# Patient Record
Sex: Male | Born: 1950 | Race: White | Hispanic: No | Marital: Married | Smoking: Former smoker
Health system: Southern US, Community
[De-identification: ages and names within clinical notes are randomized; demographics above are authoritative.]

## PROBLEM LIST (undated history)

## (undated) DIAGNOSIS — R51 Headache: Secondary | ICD-10-CM

## (undated) DIAGNOSIS — E119 Type 2 diabetes mellitus without complications: Secondary | ICD-10-CM

## (undated) DIAGNOSIS — R519 Headache, unspecified: Secondary | ICD-10-CM

## (undated) DIAGNOSIS — N200 Calculus of kidney: Secondary | ICD-10-CM

## (undated) DIAGNOSIS — R7303 Prediabetes: Secondary | ICD-10-CM

## (undated) DIAGNOSIS — D496 Neoplasm of unspecified behavior of brain: Secondary | ICD-10-CM

## (undated) DIAGNOSIS — M1990 Unspecified osteoarthritis, unspecified site: Secondary | ICD-10-CM

## (undated) DIAGNOSIS — I499 Cardiac arrhythmia, unspecified: Secondary | ICD-10-CM

## (undated) DIAGNOSIS — R000 Tachycardia, unspecified: Secondary | ICD-10-CM

## (undated) DIAGNOSIS — G4730 Sleep apnea, unspecified: Secondary | ICD-10-CM

## (undated) DIAGNOSIS — K219 Gastro-esophageal reflux disease without esophagitis: Secondary | ICD-10-CM

## (undated) HISTORY — PX: OTHER SURGICAL HISTORY: SHX169

## (undated) HISTORY — PX: COLONOSCOPY: SHX174

---

## 2005-07-12 ENCOUNTER — Emergency Department: Payer: Self-pay | Admitting: Emergency Medicine

## 2010-03-30 ENCOUNTER — Ambulatory Visit: Payer: Self-pay | Admitting: Gastroenterology

## 2013-10-15 ENCOUNTER — Ambulatory Visit: Payer: Self-pay | Admitting: Family Medicine

## 2015-04-16 ENCOUNTER — Other Ambulatory Visit: Payer: Self-pay | Admitting: Neurology

## 2015-04-16 DIAGNOSIS — R51 Headache: Principal | ICD-10-CM

## 2015-04-16 DIAGNOSIS — R519 Headache, unspecified: Secondary | ICD-10-CM

## 2015-05-08 ENCOUNTER — Ambulatory Visit
Admission: RE | Admit: 2015-05-08 | Discharge: 2015-05-08 | Disposition: A | Discharge status: Home / Self Care | Payer: BLUE CROSS/BLUE SHIELD | Source: Ambulatory Visit | Attending: Neurology | Admitting: Neurology

## 2015-05-08 DIAGNOSIS — R6889 Other general symptoms and signs: Secondary | ICD-10-CM | POA: Diagnosis present

## 2015-05-08 DIAGNOSIS — R519 Headache, unspecified: Secondary | ICD-10-CM

## 2015-05-08 DIAGNOSIS — G44229 Chronic tension-type headache, not intractable: Secondary | ICD-10-CM | POA: Diagnosis present

## 2015-05-08 DIAGNOSIS — R51 Headache: Secondary | ICD-10-CM

## 2015-07-16 ENCOUNTER — Other Ambulatory Visit: Payer: Self-pay | Admitting: Neurology

## 2015-07-16 DIAGNOSIS — G44229 Chronic tension-type headache, not intractable: Secondary | ICD-10-CM

## 2015-07-16 DIAGNOSIS — R9089 Other abnormal findings on diagnostic imaging of central nervous system: Secondary | ICD-10-CM

## 2015-07-23 ENCOUNTER — Ambulatory Visit
Admission: RE | Admit: 2015-07-23 | Discharge: 2015-07-23 | Disposition: A | Discharge status: Home / Self Care | Payer: BLUE CROSS/BLUE SHIELD | Source: Ambulatory Visit | Attending: Neurology | Admitting: Neurology

## 2015-07-23 ENCOUNTER — Ambulatory Visit (HOSPITAL_COMMUNITY): Payer: BLUE CROSS/BLUE SHIELD

## 2015-07-23 DIAGNOSIS — R9089 Other abnormal findings on diagnostic imaging of central nervous system: Secondary | ICD-10-CM

## 2015-07-23 DIAGNOSIS — G9389 Other specified disorders of brain: Secondary | ICD-10-CM | POA: Insufficient documentation

## 2015-07-23 DIAGNOSIS — I6789 Other cerebrovascular disease: Secondary | ICD-10-CM | POA: Insufficient documentation

## 2015-07-23 DIAGNOSIS — G44229 Chronic tension-type headache, not intractable: Secondary | ICD-10-CM | POA: Insufficient documentation

## 2015-07-23 DIAGNOSIS — R930 Abnormal findings on diagnostic imaging of skull and head, not elsewhere classified: Secondary | ICD-10-CM | POA: Insufficient documentation

## 2015-07-23 MED ORDER — GADOBENATE DIMEGLUMINE 529 MG/ML IV SOLN
20.0000 mL | Freq: Once | INTRAVENOUS | Status: AC | PRN
  Administered 2015-07-23: 20 mL via INTRAVENOUS

## 2015-08-05 ENCOUNTER — Other Ambulatory Visit: Payer: Self-pay | Admitting: Neurosurgery

## 2015-08-05 ENCOUNTER — Other Ambulatory Visit (HOSPITAL_COMMUNITY): Payer: Self-pay | Admitting: Neurosurgery

## 2015-08-05 DIAGNOSIS — D496 Neoplasm of unspecified behavior of brain: Secondary | ICD-10-CM

## 2015-08-06 ENCOUNTER — Other Ambulatory Visit: Payer: Self-pay | Admitting: Neurosurgery

## 2015-08-06 DIAGNOSIS — D496 Neoplasm of unspecified behavior of brain: Secondary | ICD-10-CM

## 2015-08-11 ENCOUNTER — Ambulatory Visit
Admission: RE | Admit: 2015-08-11 | Discharge: 2015-08-11 | Disposition: A | Discharge status: Home / Self Care | Payer: BLUE CROSS/BLUE SHIELD | Source: Ambulatory Visit | Attending: Neurosurgery | Admitting: Neurosurgery

## 2015-08-11 DIAGNOSIS — N200 Calculus of kidney: Secondary | ICD-10-CM | POA: Insufficient documentation

## 2015-08-11 DIAGNOSIS — D496 Neoplasm of unspecified behavior of brain: Secondary | ICD-10-CM | POA: Diagnosis not present

## 2015-08-11 DIAGNOSIS — M5136 Other intervertebral disc degeneration, lumbar region: Secondary | ICD-10-CM | POA: Diagnosis not present

## 2015-08-11 DIAGNOSIS — K760 Fatty (change of) liver, not elsewhere classified: Secondary | ICD-10-CM | POA: Insufficient documentation

## 2015-08-11 DIAGNOSIS — K5730 Diverticulosis of large intestine without perforation or abscess without bleeding: Secondary | ICD-10-CM | POA: Diagnosis not present

## 2015-08-11 HISTORY — DX: Tachycardia, unspecified: R00.0

## 2015-08-11 LAB — POCT I-STAT CREATININE: Creatinine, Ser: 0.8 mg/dL (ref 0.61–1.24)

## 2015-08-11 MED ORDER — IOPAMIDOL (ISOVUE-370) INJECTION 76%
100.0000 mL | Freq: Once | INTRAVENOUS | Status: AC | PRN
  Administered 2015-08-11: 100 mL via INTRAVENOUS

## 2015-08-12 ENCOUNTER — Other Ambulatory Visit: Payer: Self-pay | Admitting: Neurosurgery

## 2015-08-14 ENCOUNTER — Encounter (HOSPITAL_COMMUNITY)
Admission: RE | Admit: 2015-08-14 | Discharge: 2015-08-14 | Disposition: A | Discharge status: Home / Self Care | Payer: BLUE CROSS/BLUE SHIELD | Source: Ambulatory Visit | Attending: Neurosurgery | Admitting: Neurosurgery

## 2015-08-14 ENCOUNTER — Other Ambulatory Visit: Payer: Self-pay

## 2015-08-14 ENCOUNTER — Encounter (HOSPITAL_COMMUNITY): Payer: Self-pay

## 2015-08-14 DIAGNOSIS — G939 Disorder of brain, unspecified: Secondary | ICD-10-CM | POA: Diagnosis present

## 2015-08-14 HISTORY — DX: Unspecified osteoarthritis, unspecified site: M19.90

## 2015-08-14 HISTORY — DX: Type 2 diabetes mellitus without complications: E11.9

## 2015-08-14 HISTORY — DX: Gastro-esophageal reflux disease without esophagitis: K21.9

## 2015-08-14 HISTORY — DX: Headache: R51

## 2015-08-14 HISTORY — DX: Cardiac arrhythmia, unspecified: I49.9

## 2015-08-14 HISTORY — DX: Sleep apnea, unspecified: G47.30

## 2015-08-14 HISTORY — DX: Headache, unspecified: R51.9

## 2015-08-14 LAB — COMPREHENSIVE METABOLIC PANEL
ALK PHOS: 48 U/L (ref 38–126)
ALT: 61 U/L (ref 17–63)
ANION GAP: 10 (ref 5–15)
AST: 47 U/L — ABNORMAL HIGH (ref 15–41)
Albumin: 4.1 g/dL (ref 3.5–5.0)
BUN: 15 mg/dL (ref 6–20)
CALCIUM: 10.4 mg/dL — AB (ref 8.9–10.3)
CHLORIDE: 102 mmol/L (ref 101–111)
CO2: 24 mmol/L (ref 22–32)
Creatinine, Ser: 0.83 mg/dL (ref 0.61–1.24)
GFR calc non Af Amer: >60 mL/min (ref >60)
Glucose, Bld: 117 mg/dL — ABNORMAL HIGH (ref 65–99)
POTASSIUM: 4 mmol/L (ref 3.5–5.1)
SODIUM: 136 mmol/L (ref 135–145)
Total Bilirubin: 1.1 mg/dL (ref 0.3–1.2)
Total Protein: 7.5 g/dL (ref 6.5–8.1)

## 2015-08-14 LAB — CBC
HCT: 45.6 % (ref 39.0–52.0)
Hemoglobin: 15.7 g/dL (ref 13.0–17.0)
MCH: 31.8 pg (ref 26.0–34.0)
MCHC: 34.4 g/dL (ref 30.0–36.0)
MCV: 92.5 fL (ref 78.0–100.0)
PLATELETS: 231 10*3/uL (ref 150–400)
RBC: 4.93 MIL/uL (ref 4.22–5.81)
RDW: 12.8 % (ref 11.5–15.5)
WBC: 7.1 10*3/uL (ref 4.0–10.5)

## 2015-08-14 LAB — TYPE AND SCREEN
ABO/RH(D): A POS
ANTIBODY SCREEN: NEGATIVE

## 2015-08-14 LAB — ABO/RH: ABO/RH(D): A POS

## 2015-08-14 NOTE — Pre-Procedure Instructions (Signed)
David Archer [facility]  08/14/2015      [id number] [place] - [address] [place] [address]Phone: [phone]: [phone]   Your procedure is scheduled on Monday, April 17th   [facility]             (Arrival time is per your surgeon's request)   Call this number if you have problems the morning of surgery:  [phone]   Remember:  Do not eat food or drink liquids after midnight Sunday.   Take these medicines the morning of surgery with A SIP OF WATER : Flexeril, Metoprolol.  Please use your Flonase that mornig.             (4-5 days prior to surgery, STOP taking any blood thinners, vitamins, herbal supplements, anti-inflammatories)   Do not wear jewelry - no rings or watches.  Do not wear lotions or colognes.    You may NOT wear deodorant that morning.              Men may shave face and neck.  Do not bring valuables to the hospital.  [facility] or [facility].  Contacts, dentures or bridgework may not be worn into surgery.  Leave your suitcase in the car.  After surgery it may be brought to your room. For patients admitted to the hospital, discharge time will be determined by your treatment team.    Name and phone number of your driver:     Please read over the following fact sheets that you were given. Pain Booklet, Coughing and Deep Breathing and Surgical Site Infection Prevention

## 2015-08-14 NOTE — Progress Notes (Signed)
PCP is Dr. Kandice Robinsons  LOV 04/2015  Cardio is Dr. Christianne Borrow @ [facility] [phone] for tachycardia (dx in 2012 or 13) , which is  controlled with Metoprolol.  Office is closed today (Good Friday), so I am unable to get specific info.  I am sending a fax cover sheet to [facility] to hopefully have info by Monday am.  Patient has had Echo & Stress tests in 2012.  Denies any current heart issues.

## 2015-08-15 LAB — HEMOGLOBIN A1C
Hgb A1c MFr Bld: 6.9 % — ABNORMAL HIGH (ref 4.8–5.6)
Mean Plasma Glucose: 151 mg/dL

## 2015-08-17 ENCOUNTER — Other Ambulatory Visit: Payer: Self-pay

## 2015-08-17 ENCOUNTER — Inpatient Hospital Stay (HOSPITAL_COMMUNITY): Payer: BLUE CROSS/BLUE SHIELD | Admitting: Emergency Medicine

## 2015-08-17 ENCOUNTER — Inpatient Hospital Stay (HOSPITAL_COMMUNITY)
Admission: RE | Admit: 2015-08-17 | Discharge: 2015-08-20 | DRG: 055 | Disposition: A | Discharge status: Home / Self Care | Payer: BLUE CROSS/BLUE SHIELD | Source: Ambulatory Visit | Attending: Neurosurgery | Admitting: Neurosurgery

## 2015-08-17 ENCOUNTER — Encounter (HOSPITAL_COMMUNITY)
Admission: RE | Disposition: A | Discharge status: Home / Self Care | Payer: Self-pay | Source: Ambulatory Visit | Attending: Neurosurgery

## 2015-08-17 ENCOUNTER — Inpatient Hospital Stay (HOSPITAL_COMMUNITY): Payer: BLUE CROSS/BLUE SHIELD | Admitting: Anesthesiology

## 2015-08-17 ENCOUNTER — Ambulatory Visit (HOSPITAL_COMMUNITY)
Admission: RE | Admit: 2015-08-17 | Discharge: 2015-08-17 | Disposition: A | Discharge status: Home / Self Care | Payer: BLUE CROSS/BLUE SHIELD | Source: Ambulatory Visit | Attending: Neurosurgery | Admitting: Neurosurgery

## 2015-08-17 ENCOUNTER — Encounter (HOSPITAL_COMMUNITY): Payer: Self-pay | Admitting: *Deleted

## 2015-08-17 DIAGNOSIS — E785 Hyperlipidemia, unspecified: Secondary | ICD-10-CM | POA: Diagnosis present

## 2015-08-17 DIAGNOSIS — G939 Disorder of brain, unspecified: Secondary | ICD-10-CM | POA: Insufficient documentation

## 2015-08-17 DIAGNOSIS — K219 Gastro-esophageal reflux disease without esophagitis: Secondary | ICD-10-CM | POA: Diagnosis present

## 2015-08-17 DIAGNOSIS — I4891 Unspecified atrial fibrillation: Secondary | ICD-10-CM | POA: Diagnosis not present

## 2015-08-17 DIAGNOSIS — G4730 Sleep apnea, unspecified: Secondary | ICD-10-CM | POA: Diagnosis present

## 2015-08-17 DIAGNOSIS — D496 Neoplasm of unspecified behavior of brain: Principal | ICD-10-CM | POA: Diagnosis present

## 2015-08-17 DIAGNOSIS — Z87891 Personal history of nicotine dependence: Secondary | ICD-10-CM | POA: Diagnosis not present

## 2015-08-17 DIAGNOSIS — E119 Type 2 diabetes mellitus without complications: Secondary | ICD-10-CM

## 2015-08-17 DIAGNOSIS — I440 Atrioventricular block, first degree: Secondary | ICD-10-CM | POA: Diagnosis present

## 2015-08-17 DIAGNOSIS — Z9889 Other specified postprocedural states: Secondary | ICD-10-CM

## 2015-08-17 DIAGNOSIS — I4892 Unspecified atrial flutter: Secondary | ICD-10-CM | POA: Diagnosis not present

## 2015-08-17 DIAGNOSIS — Z7984 Long term (current) use of oral hypoglycemic drugs: Secondary | ICD-10-CM

## 2015-08-17 DIAGNOSIS — Z79899 Other long term (current) drug therapy: Secondary | ICD-10-CM | POA: Diagnosis not present

## 2015-08-17 DIAGNOSIS — I480 Paroxysmal atrial fibrillation: Secondary | ICD-10-CM | POA: Diagnosis not present

## 2015-08-17 DIAGNOSIS — G9389 Other specified disorders of brain: Secondary | ICD-10-CM

## 2015-08-17 DIAGNOSIS — G4733 Obstructive sleep apnea (adult) (pediatric): Secondary | ICD-10-CM | POA: Diagnosis present

## 2015-08-17 HISTORY — PX: PR DURAL GRAFT SPINAL: 63710

## 2015-08-17 HISTORY — PX: APPLICATION OF CRANIAL NAVIGATION: SHX6578

## 2015-08-17 LAB — BASIC METABOLIC PANEL WITH GFR
Anion gap: 11 (ref 5–15)
BUN: 18 mg/dL (ref 6–20)
CO2: 21 mmol/L — ABNORMAL LOW (ref 22–32)
Calcium: 8.8 mg/dL — ABNORMAL LOW (ref 8.9–10.3)
Chloride: 105 mmol/L (ref 101–111)
Creatinine, Ser: 0.79 mg/dL (ref 0.61–1.24)
GFR calc Af Amer: >60 mL/min
GFR calc non Af Amer: >60 mL/min
Glucose, Bld: 181 mg/dL — ABNORMAL HIGH (ref 65–99)
Potassium: 4 mmol/L (ref 3.5–5.1)
Sodium: 137 mmol/L (ref 135–145)

## 2015-08-17 LAB — MAGNESIUM: Magnesium: 1.7 mg/dL (ref 1.7–2.4)

## 2015-08-17 LAB — GLUCOSE, CAPILLARY
GLUCOSE-CAPILLARY: 104 mg/dL — AB (ref 65–99)
GLUCOSE-CAPILLARY: 183 mg/dL — AB (ref 65–99)
Glucose-Capillary: 137 mg/dL — ABNORMAL HIGH (ref 65–99)
Glucose-Capillary: 115 mg/dL — ABNORMAL HIGH (ref 65–99)
Glucose-Capillary: 98 mg/dL (ref 65–99)
Glucose-Capillary: 98 mg/dL (ref 65–99)

## 2015-08-17 SURGERY — FRAMELESS STEREOTACTIC BIOPSY
Anesthesia: General | Site: Head

## 2015-08-17 MED ORDER — PROPOFOL 10 MG/ML IV BOLUS
INTRAVENOUS | Status: DC | PRN
  Administered 2015-08-17: 150 mg via INTRAVENOUS
  Administered 2015-08-17: 50 mg via INTRAVENOUS

## 2015-08-17 MED ORDER — HYDROMORPHONE HCL 1 MG/ML IJ SOLN: 0.2500 mg | INTRAMUSCULAR | Status: DC | PRN

## 2015-08-17 MED ORDER — CEFAZOLIN SODIUM-DEXTROSE 2-4 GM/100ML-% IV SOLN
2.0000 g | INTRAVENOUS | Status: AC
  Administered 2015-08-17: 2 g via INTRAVENOUS

## 2015-08-17 MED ORDER — CEFAZOLIN SODIUM-DEXTROSE 2-4 GM/100ML-% IV SOLN
INTRAVENOUS | Status: AC
  Filled 2015-08-17: qty 100

## 2015-08-17 MED ORDER — PHENYLEPHRINE 40 MCG/ML (10ML) SYRINGE FOR IV PUSH (FOR BLOOD PRESSURE SUPPORT)
PREFILLED_SYRINGE | INTRAVENOUS | Status: AC
Start: 2015-08-17 — End: 2015-08-17
  Filled 2015-08-17: qty 10

## 2015-08-17 MED ORDER — ESMOLOL HCL 100 MG/10ML IV SOLN
INTRAVENOUS | Status: AC
  Filled 2015-08-17: qty 10

## 2015-08-17 MED ORDER — NORTRIPTYLINE HCL 10 MG PO CAPS
20.0000 mg | ORAL_CAPSULE | Freq: Every day | ORAL | Status: DC
  Administered 2015-08-17 – 2015-08-19 (×3): 20 mg via ORAL
  Filled 2015-08-17 (×3): qty 2

## 2015-08-17 MED ORDER — LABETALOL HCL 5 MG/ML IV SOLN
5.0000 mg | INTRAVENOUS | Status: DC | PRN
  Administered 2015-08-17: 5 mg via INTRAVENOUS

## 2015-08-17 MED ORDER — ONDANSETRON HCL 4 MG/2ML IJ SOLN: 4.0000 mg | Freq: Once | INTRAMUSCULAR | Status: DC | PRN

## 2015-08-17 MED ORDER — POTASSIUM CHLORIDE IN NACL 20-0.9 MEQ/L-% IV SOLN
INTRAVENOUS | Status: DC
Start: 2015-08-17 — End: 2015-08-20
  Administered 2015-08-17: 22:00:00 via INTRAVENOUS
  Filled 2015-08-17 (×6): qty 1000

## 2015-08-17 MED ORDER — DOCUSATE SODIUM 100 MG PO CAPS
100.0000 mg | ORAL_CAPSULE | Freq: Two times a day (BID) | ORAL | Status: DC
  Administered 2015-08-17 – 2015-08-20 (×6): 100 mg via ORAL
  Filled 2015-08-17 (×6): qty 1

## 2015-08-17 MED ORDER — ESMOLOL HCL 100 MG/10ML IV SOLN
INTRAVENOUS | Status: AC
Start: 2015-08-17 — End: 2015-08-17
  Filled 2015-08-17: qty 10

## 2015-08-17 MED ORDER — MIDAZOLAM HCL 5 MG/5ML IJ SOLN
INTRAMUSCULAR | Status: DC | PRN
  Administered 2015-08-17: 2 mg via INTRAVENOUS

## 2015-08-17 MED ORDER — LIDOCAINE HCL (CARDIAC) 20 MG/ML IV SOLN
INTRAVENOUS | Status: AC
  Filled 2015-08-17: qty 10

## 2015-08-17 MED ORDER — CEFAZOLIN SODIUM-DEXTROSE 2-4 GM/100ML-% IV SOLN
2.0000 g | Freq: Three times a day (TID) | INTRAVENOUS | Status: AC
  Administered 2015-08-17 – 2015-08-18 (×2): 2 g via INTRAVENOUS
  Filled 2015-08-17 (×2): qty 100

## 2015-08-17 MED ORDER — POTASSIUM 99 MG PO TABS: 1.0000 | ORAL_TABLET | Freq: Every day | ORAL | Status: DC

## 2015-08-17 MED ORDER — ROCURONIUM BROMIDE 50 MG/5ML IV SOLN
INTRAVENOUS | Status: AC
  Filled 2015-08-17: qty 2

## 2015-08-17 MED ORDER — MIDAZOLAM HCL 2 MG/2ML IJ SOLN
INTRAMUSCULAR | Status: AC
  Filled 2015-08-17: qty 2

## 2015-08-17 MED ORDER — LORAZEPAM 2 MG/ML IJ SOLN
INTRAMUSCULAR | Status: AC
  Filled 2015-08-17: qty 1

## 2015-08-17 MED ORDER — DEXAMETHASONE SODIUM PHOSPHATE 4 MG/ML IJ SOLN
INTRAMUSCULAR | Status: DC | PRN
  Administered 2015-08-17: 8 mg via INTRAVENOUS

## 2015-08-17 MED ORDER — SUGAMMADEX SODIUM 500 MG/5ML IV SOLN
INTRAVENOUS | Status: AC
  Filled 2015-08-17: qty 5

## 2015-08-17 MED ORDER — ONDANSETRON HCL 4 MG/2ML IJ SOLN
4.0000 mg | INTRAMUSCULAR | Status: DC | PRN
  Administered 2015-08-18: 4 mg via INTRAVENOUS
  Filled 2015-08-17: qty 2

## 2015-08-17 MED ORDER — MEPERIDINE HCL 25 MG/ML IJ SOLN: 6.2500 mg | INTRAMUSCULAR | Status: DC | PRN

## 2015-08-17 MED ORDER — LACTATED RINGERS IV SOLN
INTRAVENOUS | Status: DC
  Administered 2015-08-17 (×2): via INTRAVENOUS

## 2015-08-17 MED ORDER — PROPOFOL 10 MG/ML IV BOLUS
INTRAVENOUS | Status: AC
  Filled 2015-08-17: qty 20

## 2015-08-17 MED ORDER — ROSUVASTATIN CALCIUM 10 MG PO TABS
10.0000 mg | ORAL_TABLET | Freq: Every day | ORAL | Status: DC
  Administered 2015-08-18 – 2015-08-20 (×3): 10 mg via ORAL
  Filled 2015-08-17 (×3): qty 1

## 2015-08-17 MED ORDER — SUCCINYLCHOLINE CHLORIDE 20 MG/ML IJ SOLN
INTRAMUSCULAR | Status: AC
  Filled 2015-08-17: qty 1

## 2015-08-17 MED ORDER — LORAZEPAM 2 MG/ML IJ SOLN: 1.0000 mg | Freq: Once | INTRAMUSCULAR | Status: DC

## 2015-08-17 MED ORDER — DIPHENHYDRAMINE HCL 50 MG/ML IJ SOLN
INTRAMUSCULAR | Status: AC
  Filled 2015-08-17: qty 1

## 2015-08-17 MED ORDER — BUPIVACAINE-EPINEPHRINE 0.5% -1:200000 IJ SOLN
INTRAMUSCULAR | Status: DC | PRN
Start: 2015-08-17 — End: 2015-08-17
  Administered 2015-08-17: 3 mL

## 2015-08-17 MED ORDER — FENTANYL CITRATE (PF) 250 MCG/5ML IJ SOLN
INTRAMUSCULAR | Status: AC
  Filled 2015-08-17: qty 5

## 2015-08-17 MED ORDER — LIDOCAINE HCL (CARDIAC) 20 MG/ML IV SOLN
INTRAVENOUS | Status: DC | PRN
  Administered 2015-08-17: 60 mg via INTRAVENOUS

## 2015-08-17 MED ORDER — HEMOSTATIC AGENTS (NO CHARGE) OPTIME
TOPICAL | Status: DC | PRN
  Administered 2015-08-17: 1 via TOPICAL

## 2015-08-17 MED ORDER — EPHEDRINE SULFATE 50 MG/ML IJ SOLN
INTRAMUSCULAR | Status: AC
  Filled 2015-08-17: qty 1

## 2015-08-17 MED ORDER — SODIUM CHLORIDE 0.9 % IJ SOLN
INTRAMUSCULAR | Status: AC
  Filled 2015-08-17: qty 10

## 2015-08-17 MED ORDER — INSULIN ASPART 100 UNIT/ML ~~LOC~~ SOLN
0.0000 [IU] | SUBCUTANEOUS | Status: DC
  Administered 2015-08-18: 3 [IU] via SUBCUTANEOUS
  Administered 2015-08-18 (×4): 4 [IU] via SUBCUTANEOUS
  Administered 2015-08-18: 7 [IU] via SUBCUTANEOUS
  Administered 2015-08-19 – 2015-08-20 (×8): 3 [IU] via SUBCUTANEOUS

## 2015-08-17 MED ORDER — DILTIAZEM HCL 100 MG IV SOLR
5.0000 mg/h | INTRAVENOUS | Status: DC
  Administered 2015-08-17: 10 mg/h via INTRAVENOUS
  Administered 2015-08-18: 20 mg/h via INTRAVENOUS
  Filled 2015-08-17 (×2): qty 100

## 2015-08-17 MED ORDER — SODIUM CHLORIDE 0.9 % IR SOLN
Status: DC | PRN
  Administered 2015-08-17: 19:00:00

## 2015-08-17 MED ORDER — LORATADINE 10 MG PO TABS: 10.0000 mg | ORAL_TABLET | Freq: Every day | ORAL | Status: DC | PRN

## 2015-08-17 MED ORDER — PANTOPRAZOLE SODIUM 40 MG IV SOLR
40.0000 mg | Freq: Every day | INTRAVENOUS | Status: DC
  Administered 2015-08-17 – 2015-08-18 (×2): 40 mg via INTRAVENOUS
  Filled 2015-08-17: qty 40

## 2015-08-17 MED ORDER — PROMETHAZINE HCL 25 MG PO TABS: 12.5000 mg | ORAL_TABLET | ORAL | Status: DC | PRN

## 2015-08-17 MED ORDER — DEXAMETHASONE SODIUM PHOSPHATE 4 MG/ML IJ SOLN
INTRAMUSCULAR | Status: AC
  Filled 2015-08-17: qty 2

## 2015-08-17 MED ORDER — METOPROLOL TARTRATE 1 MG/ML IV SOLN
INTRAVENOUS | Status: AC
  Filled 2015-08-17: qty 5

## 2015-08-17 MED ORDER — LABETALOL HCL 5 MG/ML IV SOLN: 10.0000 mg | INTRAVENOUS | Status: DC | PRN

## 2015-08-17 MED ORDER — MORPHINE SULFATE (PF) 2 MG/ML IV SOLN
1.0000 mg | INTRAVENOUS | Status: DC | PRN
  Administered 2015-08-18: 1 mg via INTRAVENOUS
  Filled 2015-08-17: qty 1

## 2015-08-17 MED ORDER — CYCLOBENZAPRINE HCL 10 MG PO TABS: 10.0000 mg | ORAL_TABLET | Freq: Two times a day (BID) | ORAL | Status: DC | PRN

## 2015-08-17 MED ORDER — METOPROLOL SUCCINATE ER 25 MG PO TB24
50.0000 mg | ORAL_TABLET | Freq: Every day | ORAL | Status: DC
  Administered 2015-08-18: 50 mg via ORAL
  Filled 2015-08-17: qty 2

## 2015-08-17 MED ORDER — ROCURONIUM BROMIDE 100 MG/10ML IV SOLN
INTRAVENOUS | Status: DC | PRN
  Administered 2015-08-17 (×2): 10 mg via INTRAVENOUS
  Administered 2015-08-17: 50 mg via INTRAVENOUS

## 2015-08-17 MED ORDER — ESMOLOL HCL-SODIUM CHLORIDE 2000 MG/100ML IV SOLN: 25.0000 ug/kg/min | Freq: Once | INTRAVENOUS | Status: DC

## 2015-08-17 MED ORDER — ARTIFICIAL TEARS OP OINT
TOPICAL_OINTMENT | OPHTHALMIC | Status: AC
  Filled 2015-08-17: qty 3.5

## 2015-08-17 MED ORDER — ALBUTEROL SULFATE HFA 108 (90 BASE) MCG/ACT IN AERS
INHALATION_SPRAY | RESPIRATORY_TRACT | Status: AC
  Filled 2015-08-17: qty 6.7

## 2015-08-17 MED ORDER — 0.9 % SODIUM CHLORIDE (POUR BTL) OPTIME
TOPICAL | Status: DC | PRN
  Administered 2015-08-17: 1000 mL

## 2015-08-17 MED ORDER — ADENOSINE 12 MG/4ML IV SOLN
12.0000 mg | Freq: Once | INTRAVENOUS | Status: AC
  Administered 2015-08-17: 6 mg via INTRAVENOUS

## 2015-08-17 MED ORDER — ACETAMINOPHEN 650 MG RE SUPP: 650.0000 mg | RECTAL | Status: DC | PRN

## 2015-08-17 MED ORDER — ESMOLOL HCL 100 MG/10ML IV SOLN
INTRAVENOUS | Status: DC | PRN
  Administered 2015-08-17: 30 mg via INTRAVENOUS
  Administered 2015-08-17: 20 mg via INTRAVENOUS

## 2015-08-17 MED ORDER — SUGAMMADEX SODIUM 200 MG/2ML IV SOLN
INTRAVENOUS | Status: DC | PRN
  Administered 2015-08-17: 200 mg via INTRAVENOUS

## 2015-08-17 MED ORDER — ROCURONIUM BROMIDE 50 MG/5ML IV SOLN
INTRAVENOUS | Status: AC
  Filled 2015-08-17: qty 1

## 2015-08-17 MED ORDER — ACETAMINOPHEN 325 MG PO TABS: 650.0000 mg | ORAL_TABLET | ORAL | Status: DC | PRN

## 2015-08-17 MED ORDER — FLUTICASONE PROPIONATE 50 MCG/ACT NA SUSP
2.0000 | Freq: Every day | NASAL | Status: DC | PRN
  Filled 2015-08-17: qty 16

## 2015-08-17 MED ORDER — ONDANSETRON HCL 4 MG/2ML IJ SOLN
INTRAMUSCULAR | Status: DC | PRN
  Administered 2015-08-17: 4 mg via INTRAVENOUS

## 2015-08-17 MED ORDER — HYDROCODONE-ACETAMINOPHEN 5-325 MG PO TABS
1.0000 | ORAL_TABLET | ORAL | Status: DC | PRN
  Administered 2015-08-18 – 2015-08-20 (×4): 1 via ORAL
  Filled 2015-08-17 (×4): qty 1

## 2015-08-17 MED ORDER — PHENYLEPHRINE 40 MCG/ML (10ML) SYRINGE FOR IV PUSH (FOR BLOOD PRESSURE SUPPORT)
PREFILLED_SYRINGE | INTRAVENOUS | Status: AC
  Filled 2015-08-17: qty 10

## 2015-08-17 MED ORDER — ONDANSETRON HCL 4 MG PO TABS: 4.0000 mg | ORAL_TABLET | ORAL | Status: DC | PRN

## 2015-08-17 MED ORDER — FENTANYL CITRATE (PF) 100 MCG/2ML IJ SOLN
INTRAMUSCULAR | Status: DC | PRN
  Administered 2015-08-17: 50 ug via INTRAVENOUS
  Administered 2015-08-17 (×2): 100 ug via INTRAVENOUS
  Administered 2015-08-17: 50 ug via INTRAVENOUS

## 2015-08-17 MED ORDER — ONDANSETRON HCL 4 MG/2ML IJ SOLN
INTRAMUSCULAR | Status: AC
  Filled 2015-08-17: qty 2

## 2015-08-17 MED ORDER — ADENOSINE 12 MG/4ML IV SOLN
12.0000 mg | Freq: Once | INTRAVENOUS | Status: AC
  Administered 2015-08-17: 12 mg via INTRAVENOUS

## 2015-08-17 SURGICAL SUPPLY — 53 items
BANDAGE ADH SHEER 1  50/CT (GAUZE/BANDAGES/DRESSINGS) IMPLANT
BLADE CLIPPER SURG (BLADE) IMPLANT
BLADE SURG 10 STRL SS (BLADE) ×4 IMPLANT
BLADE SURG 15 STRL LF DISP TIS (BLADE) ×2 IMPLANT
BLADE SURG 15 STRL SS (BLADE) ×2
BUR ACORN 6.0 PRECISION (BURR) ×3 IMPLANT
BUR ACORN 6.0MM PRECISION (BURR) ×1
CANISTER SUCT 3000ML PPV (MISCELLANEOUS) ×4 IMPLANT
CORDS BIPOLAR (ELECTRODE) ×4 IMPLANT
COVER MAYO STAND STRL (DRAPES) ×4 IMPLANT
DRAPE INCISE IOBAN 66X45 STRL (DRAPES) ×4 IMPLANT
DRAPE POUCH INSTRU U-SHP 10X18 (DRAPES) ×4 IMPLANT
ELECT CAUTERY BLADE 6.4 (BLADE) ×4 IMPLANT
ELECT REM PT RETURN 9FT ADLT (ELECTROSURGICAL) ×4
ELECTRODE REM PT RTRN 9FT ADLT (ELECTROSURGICAL) ×2 IMPLANT
GAUZE SPONGE 4X4 12PLY STRL (GAUZE/BANDAGES/DRESSINGS) ×4 IMPLANT
GAUZE SPONGE 4X4 16PLY XRAY LF (GAUZE/BANDAGES/DRESSINGS) ×4 IMPLANT
GLOVE BIO SURGEON STRL SZ 6.5 (GLOVE) ×9 IMPLANT
GLOVE BIO SURGEON STRL SZ8 (GLOVE) ×12 IMPLANT
GLOVE BIO SURGEON STRL SZ8.5 (GLOVE) ×4 IMPLANT
GLOVE BIO SURGEONS STRL SZ 6.5 (GLOVE) ×3
GLOVE BIOGEL PI IND STRL 7.0 (GLOVE) ×4 IMPLANT
GLOVE BIOGEL PI IND STRL 7.5 (GLOVE) ×4 IMPLANT
GLOVE BIOGEL PI IND STRL 8 (GLOVE) ×2 IMPLANT
GLOVE BIOGEL PI INDICATOR 7.0 (GLOVE) ×4
GLOVE BIOGEL PI INDICATOR 7.5 (GLOVE) ×4
GLOVE BIOGEL PI INDICATOR 8 (GLOVE) ×2
GOWN STRL REUS W/ TWL LRG LVL3 (GOWN DISPOSABLE) ×4 IMPLANT
GOWN STRL REUS W/ TWL XL LVL3 (GOWN DISPOSABLE) ×2 IMPLANT
GOWN STRL REUS W/TWL LRG LVL3 (GOWN DISPOSABLE) ×4
GOWN STRL REUS W/TWL XL LVL3 (GOWN DISPOSABLE) ×2
KIT BASIN OR (CUSTOM PROCEDURE TRAY) ×4 IMPLANT
KIT ROOM TURNOVER OR (KITS) ×4 IMPLANT
MARKER SPHERE PSV REFLC 13MM (MARKER) ×12 IMPLANT
NEEDLE HYPO 25X1 1.5 SAFETY (NEEDLE) ×4 IMPLANT
NS IRRIG 1000ML POUR BTL (IV SOLUTION) ×4 IMPLANT
PACK EENT II TURBAN DRAPE (CUSTOM PROCEDURE TRAY) ×4 IMPLANT
PAD ARMBOARD 7.5X6 YLW CONV (MISCELLANEOUS) ×12 IMPLANT
PATTIES SURGICAL .5 X.5 (GAUZE/BANDAGES/DRESSINGS) ×4 IMPLANT
PENCIL BUTTON HOLSTER BLD 10FT (ELECTRODE) ×4 IMPLANT
SPONGE SURGIFOAM ABS GEL SZ50 (HEMOSTASIS) ×4 IMPLANT
STAPLER SKIN PROX WIDE 3.9 (STAPLE) ×4 IMPLANT
SUT BONE WAX W31G (SUTURE) ×4 IMPLANT
SUT VIC AB 2-0 CP2 18 (SUTURE) ×4 IMPLANT
SYR 5ML LUER SLIP (SYRINGE) ×8 IMPLANT
SYR BULB 3OZ (MISCELLANEOUS) ×4 IMPLANT
SYR CONTROL 10ML LL (SYRINGE) ×4 IMPLANT
TAPE CLOTH SURG 4X10 WHT LF (GAUZE/BANDAGES/DRESSINGS) ×4 IMPLANT
TOWEL OR 17X24 6PK STRL BLUE (TOWEL DISPOSABLE) ×4 IMPLANT
TOWEL OR 17X26 10 PK STRL BLUE (TOWEL DISPOSABLE) ×4 IMPLANT
TUBE CONNECTING 12'X1/4 (SUCTIONS) ×1
TUBE CONNECTING 12X1/4 (SUCTIONS) ×3 IMPLANT
WATER STERILE IRR 1000ML POUR (IV SOLUTION) ×4 IMPLANT

## 2015-08-17 NOTE — Anesthesia Procedure Notes (Signed)
Procedure Name: Intubation Date/Time: 08/17/2015 6:16 PM Performed by: Lance Coon Pre-anesthesia Checklist: Patient identified, Timeout performed, Emergency Drugs available, Suction available and Patient being monitored Patient Re-evaluated:Patient Re-evaluated prior to inductionOxygen Delivery Method: Circle system utilized Preoxygenation: Pre-oxygenation with 100% oxygen Intubation Type: IV induction Ventilation: Mask ventilation without difficulty Laryngoscope Size: Miller and 2 Grade View: Grade I Tube size: 7.5 mm Number of attempts: 1 Airway Equipment and Method: Stylet Placement Confirmation: ETT inserted through vocal cords under direct vision,  breath sounds checked- equal and bilateral and positive ETCO2 Secured at: 23 cm Tube secured with: Tape Dental Injury: Teeth and Oropharynx as per pre-operative assessment

## 2015-08-17 NOTE — Op Note (Signed)
Brief history: The patient is a 65 year old white male who has a history of headaches. He had a syncopal episode and was worked up with a brain MRI. This demonstrated a left frontal intra-axial brain lesion. A repeat MRI scan demonstrated this lesion had enlarged. I discussed the situation with the patient and his wife. We discussed the various treatment options including a stereotactic brain biopsy. The patient has weighed the risks, benefits, and alternatives to surgery and decided to proceed with a stereotactic brain biopsy.  Preoperative diagnosis: Left frontal brain tumor  Postop diagnosis: The same  Procedure: Left stereotactic brain biopsy via burr hole using the BrainLab neuro navigation system  Surgeon: Dr. Earle Gell  Assistant: None  Anesthesia: Gen. endotracheal  Estimated blood loss: Minimal  Specimens: Brain biopsies  Conversations: None  Drains: None  Description of procedure: The patient was brought to the operating room by the anesthesia team. General endotracheal anesthesia was induced. The patient remained in the supine position. I applied the [place] 3 point head rest the patient's calvarium. The patient's preoperative MRI was loaded into the BrainLab neuronavigational system. We used surface points to register the patient's calvarium on the computer. The patient's left frontal scalp was then shaved with clippers and prepared with Betadine scrub and Betadine solution. Sterile drapes were applied. I then injected the area to be incised with Marcaine with epinephrine solution. I made a small incision in the patient's left frontal scalp just behind his hairline. I used electro cautery to expose the underlying calvarium. I used the Geologist, engineering for exposure. I then used a high-speed drill to create a left frontal burr hole. I coagulated a small hole in the dura at the entry site. We used the BrainLab neuronavigational system to guide [place] in the trajectory of the  placement of the biopsy needle. We obtained 2 specimens. We did not note any bleeding. I then withdrew the biopsy needle. I placed Gelfoam over the exposed dura. I removed the Weitlanter retractor and reapproximated patient's galea with interrupted 2-0 Vicryl suture. I reapproximated the scalp with stainless steel staples. The wound was then coated with bacitracin ointment. A sterile dressing was applied. The drapes were removed. I then removed the [place] 3 point headrest from the patient's calvarium. This completed the procedure. By report all sponge, instrument, and needle counts were correct at the end of this case.

## 2015-08-17 NOTE — Consult Note (Signed)
Cardiology Consult    [name] MRN: [id number], DOB/AGE: 63-Sep-1952   Admit date: 08/17/2015 Date of Consult: 08/17/2015  Primary Physician: David Daisy, MD Primary Cardiologist: David Archer Requesting Provider: Roderic Palau, MD   Patient Profile    89M history of SVT treated with long acting and PRN metoprol, OSA, HLD, diet controlled diabetes and an enlarging left frontal brain lesion (diagnosed during work-up for chronic headaches) s/p sterotactic brain biopsy who had post operative AF.  Past Medical History   Past Medical History  Diagnosis Date  . Tachycardia   . Dysrhythmia     tachycardia  . Sleep apnea     back in 2000.  Hasn't been retested  . Diabetes mellitus without complication ([place])     "Pre" diet controlled   NO meds  . GERD (gastroesophageal reflux disease)     takes OTC MEDS  . Headache   . Arthritis     Past Surgical History  Procedure Laterality Date  . Colonoscopy    . Has never had      HAS NEVER HAD ANESTHESIA--NO SURGERIES     Allergies  No Known Allergies  History of Present Illness     89M history of SVT treated with long acting and PRN metoprol, OSA, HLD, diet controlled diabetes and an enlarging left frontal brain lesion (diagnosed during work-up for chronic headaches) s/p sterotactic brain biopsy who had post operative AF. David Archer had an uncomplicated stereotactic brain biopsy today with David Archer. On arrival to the floor at around 9pm, he was noted to be in a rapid narrow complex tachycardia with rates between 140-160bpm. He was asymptomatic. He was given '50mg'$  esmolol, '5mg'$  IV lopressor without response. He was subsequently given 6 and then '12mg'$  of IV adenosine which confirmed AF. He was started on a diltiazem drip. Cardiology was consulted for new onset AF.   The patient reports he has a long history of SVT. He is on daily metoprolol and takes occasional PRN doses for episodes of tachycardia. He has  episodes about 2x per month and they last about a minute. He has followed with Dr. Bartholome Archer for this problem.  He denies a history of AF. He reports his SVT symptoms include sweating and feeling a pounding sensation. He does not feel those symptoms now, while in AF. He also has a history of about monthly pre-syncope (without any syncope) which has been going on for about a month. He does not feel any of those symptoms at this time. Labs from 4/14 demonstrated K 4.0, Cr 0.83, Hgb A1c 6.9.    Inpatient Medications    . ceFAZolin      . insulin aspart  0-20 Units Subcutaneous 6 times per day  . LORazepam      . LORazepam  1 mg Intravenous Once    Family History    History reviewed. No pertinent family history.  Social History    Social History   Social History  . Marital Status: Married    Spouse Name: N/A  . Number of Children: N/A  . Years of Education: N/A   Occupational History  . Not on file.   Social History Main Topics  . Smoking status: Former Smoker -- 1.00 packs/day for 70 years    Types: Cigarettes  . Smokeless tobacco: Former Systems developer    Quit date: 08/13/2008  . Alcohol Use: 16.2 oz/week    14 Glasses of wine, 1 Cans of beer, 12 Shots of  liquor per week     Comment: SWITCHES BETWEEN BEVERAGES-- GOES IN SPELLS  . Drug Use: No  . Sexual Activity: Not on file   Other Topics Concern  . Not on file   Social History Narrative     Review of Systems    General:  No chills, fever, night sweats or weight changes.  Cardiovascular:  No chest pain, dyspnea on exertion, edema, orthopnea, palpitations, paroxysmal nocturnal dyspnea. Dermatological: No rash, lesions/masses Respiratory: No cough, dyspnea Urologic: No hematuria, dysuria Abdominal:   No nausea, vomiting, diarrhea, bright red blood per rectum, melena, or hematemesis Neurologic:  No visual changes, wkns, changes in mental status. All other systems reviewed and are otherwise negative except as noted  above.  Physical Exam    Blood pressure 98/68, pulse 85, temperature 97.8 F (36.6 C), temperature source Oral, resp. rate 20, height '5\' 11"'$  (1.803 m), weight 109.6 kg (241 lb 10 oz), SpO2 95 %.  General: Pleasant, NAD. Bandage over left frontal skull Psych: Normal affect. Neuro: Alert. Moves all extremities spontaneously. HEENT: Normal  Neck: Supple without bruits or JVD. Lungs:  Resp regular and unlabored, CTA. Heart: Irregular and tachycardia.  Abdomen: Soft, non-tender, non-distended, BS + x 4.  Extremities: No clubbing, cyanosis or edema. DP/PT/Radials 2+ and equal bilaterally.  Labs    Troponin (Point of Care Test) No results for input(s): TROPIPOC in the last 72 hours. No results for input(s): CKTOTAL, CKMB, TROPONINI in the last 72 hours. Lab Results  Component Value Date   WBC 7.1 08/14/2015   HGB 15.7 08/14/2015   HCT 45.6 08/14/2015   MCV 92.5 08/14/2015   PLT 231 08/14/2015    Recent Labs Lab 08/14/15 1226  NA 136  K 4.0  CL 102  CO2 24  BUN 15  CREATININE 0.83  CALCIUM 10.4*  PROT 7.5  BILITOT 1.1  ALKPHOS 48  ALT 61  AST 47*  GLUCOSE 117*   No results found for: CHOL, HDL, LDLCALC, TRIG No results found for: [facility]   Radiology Studies    Ct Chest W Contrast  08/12/2015  CLINICAL DATA:  Enlarging brain lesion. Evaluate for primary malignancy. EXAM: CT CHEST, ABDOMEN, AND PELVIS WITH CONTRAST TECHNIQUE: Multidetector CT imaging of the chest, abdomen and pelvis was performed following the standard protocol during bolus administration of intravenous contrast. CONTRAST:  100 cc Isovue 370 intravenous COMPARISON:  07/12/2005 abdominal CT FINDINGS: CT CHEST THORACIC INLET/BODY WALL: No acute abnormality. MEDIASTINUM: Normal heart size. No pericardial effusion. Atherosclerosis, including the coronary arteries. No acute vascular abnormality. No adenopathy. LUNG WINDOWS: No suspicious pulmonary nodule.  No active disease OSSEOUS: No acute fracture. No  suspicious lytic or blastic lesions. Diffuse thoracic ankylosis by osteophytes. CT ABDOMEN AND PELVIS Abdominal wall:  No contributory findings. Hepatobiliary: Simple 12 mm cyst in the low central liver. Hepatic steatosis. No evidence of solid mass.No evidence of biliary obstruction or stone. Pancreas: Unremarkable. Spleen: Unremarkable. Adrenals/Urinary Tract: Negative adrenals. No evidence of renal mass. Three left frontal calculi including a 18 mm stone in the renal pelvis. Unremarkable bladder. Reproductive:Symmetric prostate enlargement Stomach/Bowel: Extensive distal colonic diverticulosis. No appendicitis. Vascular/Lymphatic: No acute vascular abnormality. Chronic enlargement of lymph nodes in the deep liver drainage, likely reactive to chronic liver disease. Atherosclerosis with 24 mm diameter infrarenal aorta. Peritoneal: No ascites or pneumoperitoneum. Musculoskeletal: Ankylosing osteophytes throughout the thoracic spine and upper lumbar spine. There is advanced lumbar degenerative disc disease and facet arthropathy with at least moderate canal and high-grade bilateral foraminal  stenosis at L4-5. Fused right sacroiliac joint. No erosion. IMPRESSION: 1. No evidence of primary malignancy or metastatic disease in the chest, abdomen, or pelvis. 2. Hepatic steatosis, left nephrolithiasis, and extensive colonic diverticulosis. 3. Spondyloarthropathy with diffuse spinal ankylosis. Superimposed advanced lumbar disc degeneration. Electronically Signed   By: Monte Fantasia M.D.   On: 08/12/2015 08:34   [name] Contrast  08/17/2015  CLINICAL DATA:  Left frontal brain mass.  Preop for surgery. EXAM: MRI HEAD WITHOUT CONTRAST TECHNIQUE: Multiplanar, multiecho pulse sequences of the brain and surrounding structures were obtained without intravenous contrast. COMPARISON:  MRI brain 07/23/2015 and 05/08/2015. CT of the chest, abdomen, and pelvis 08/11/2015. FINDINGS: Measured on T2 weighted images, and the solid  and cystic mass lesion in the anterior left frontal lobe is stable to slightly decreased in size, measuring 15 x 11 x 10 mm. No additional lesions are present. Surrounding vasogenic edema is noted. Skullbase is within normal limits. Midline sagittal images are within normal limits. Flow is present in the major intracranial arteries. The globes and orbits are intact. The paranasal sinuses and mastoid air cells are clear. IMPRESSION: 1. Stable to slight decrease in size of solid and cystic anterior left frontal lobe mass. Although no primary lesion was evident on the CT chest, abdomen, and pelvis, the metastatic lesion is still favored. 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 Electronically Signed   By: San Morelle M.D.   On: 08/17/2015 16:39   Mr Jeri Cos ML Contrast  07/23/2015  CLINICAL DATA:  Continued surveillance abnormal LEFT frontal white matter abnormality. History of headache for 2 years, and 1 episode of imbalance while driving. EXAM: MRI HEAD WITHOUT AND WITH CONTRAST TECHNIQUE: Multiplanar, multiecho pulse sequences of the brain and surrounding structures were obtained without and with intravenous contrast. CONTRAST:  48m MULTIHANCE GADOBENATE DIMEGLUMINE 529 MG/ML IV SOLN COMPARISON:  05/08/2015. FINDINGS: No acute stroke, acute hemorrhage, hydrocephalus, or extra-axial fluid. Mild atrophy. The previously noted area of a LEFT inferior frontal white matter signal abnormality has progressed over the 2 month interval. The lesion has enlarged, becomes centrally necrotic, and is surrounded by vasogenic edema. There is irregular peripheral enhancement. No restricted diffusion centrally within limits for assessment due to proximity to the skull base. The lesion measures 10 x 16 x 10 mm. No other similar abnormalities. No osseous findings.  Extracranial soft tissues unremarkable. IMPRESSION: Enlarging LEFT frontal lesion, 10 x 16 x 10 mm with peripheral enhancement, central necrosis, and vasogenic  edema. Solitary metastatic deposit is favored. Primary brain tumor or infection are other differential considerations. A call is in to the ordering provider. Electronically Signed   By: JStaci RighterM.D.   On: 07/23/2015 10:53   Ct Abdomen Pelvis W Contrast  08/12/2015  CLINICAL DATA:  Enlarging brain lesion. Evaluate for primary malignancy. EXAM: CT CHEST, ABDOMEN, AND PELVIS WITH CONTRAST TECHNIQUE: Multidetector CT imaging of the chest, abdomen and pelvis was performed following the standard protocol during bolus administration of intravenous contrast. CONTRAST:  100 cc Isovue 370 intravenous COMPARISON:  07/12/2005 abdominal CT FINDINGS: CT CHEST THORACIC INLET/BODY WALL: No acute abnormality. MEDIASTINUM: Normal heart size. No pericardial effusion. Atherosclerosis, including the coronary arteries. No acute vascular abnormality. No adenopathy. LUNG WINDOWS: No suspicious pulmonary nodule.  No active disease OSSEOUS: No acute fracture. No suspicious lytic or blastic lesions. Diffuse thoracic ankylosis by osteophytes. CT ABDOMEN AND PELVIS Abdominal wall:  No contributory findings. Hepatobiliary: Simple 12 mm cyst in the low central liver. Hepatic  steatosis. No evidence of solid mass.No evidence of biliary obstruction or stone. Pancreas: Unremarkable. Spleen: Unremarkable. Adrenals/Urinary Tract: Negative adrenals. No evidence of renal mass. Three left frontal calculi including a 18 mm stone in the renal pelvis. Unremarkable bladder. Reproductive:Symmetric prostate enlargement Stomach/Bowel: Extensive distal colonic diverticulosis. No appendicitis. Vascular/Lymphatic: No acute vascular abnormality. Chronic enlargement of lymph nodes in the deep liver drainage, likely reactive to chronic liver disease. Atherosclerosis with 24 mm diameter infrarenal aorta. Peritoneal: No ascites or pneumoperitoneum. Musculoskeletal: Ankylosing osteophytes throughout the thoracic spine and upper lumbar spine. There is advanced  lumbar degenerative disc disease and facet arthropathy with at least moderate canal and high-grade bilateral foraminal stenosis at L4-5. Fused right sacroiliac joint. No erosion. IMPRESSION: 1. No evidence of primary malignancy or metastatic disease in the chest, abdomen, or pelvis. 2. Hepatic steatosis, left nephrolithiasis, and extensive colonic diverticulosis. 3. Spondyloarthropathy with diffuse spinal ankylosis. Superimposed advanced lumbar disc degeneration. Electronically Signed   By: Monte Fantasia M.D.   On: 08/12/2015 08:34    ECG & Cardiac Imaging    ECG 17-Aug-2015 20:39:00: AF with RVR rate 143. Diffuse mild repol abnormalities, likely rate related  Assessment & Plan    69M history of SVT treated with long acting and PRN metoprol, OSA, HLD, diet controlled diabetes and an enlarging left frontal brain lesion (diagnosed during work-up for chronic headaches) s/p sterotactic brain biopsy who had post operative AF. His CHADSVASC is 1 for DM (assuming normal LV function). He is not a candidate for anticoagulation anyways given brain biopsy.   His AF is proving to be difficult to rate control. He may require DCCV for this reason. So long as his LV function is normal, he is low enough risk it would be reasonable to consider DCCV without anticoagulation. Alternatively, if LV function and wall thickness are OK, could consider given oral flecainide.   - BMP, Mg, TSH - K>4, Mg>2 - titrate dilt to goal HR 80 - TTE in AM - NPO in the event that DCCV is required.   Tobi Bastos, MD 08/17/2015, 9:51 PM

## 2015-08-17 NOTE — Progress Notes (Signed)
Subjective:  The patient is alert and pleasant. He is in no apparent distress. He looks well.  Objective: Vital signs in last 24 hours: Temp:  [98.3 F (36.8 C)] 98.3 F (36.8 C) (04/17 0833) Pulse Rate:  [72] 72 (04/17 0833) Resp:  [18] 18 (04/17 0833) BP: (154)/(90) 154/90 mmHg (04/17 0833) SpO2:  [99 %] 99 % (04/17 0833) Weight:  [109.6 kg (241 lb 10 oz)] 109.6 kg (241 lb 10 oz) (04/17 0833)  Intake/Output from previous day:   Intake/Output this shift: Total I/O In: 250 [I.V.:250] Out: 50 [Blood:50]  Physical exam the patient is alert and pleasant. He is moving all 4 extremities well. His speech is normal. He is mildly confused.  Lab Results: No results for input(s): WBC, HGB, HCT, PLT in the last 72 hours. BMET No results for input(s): NA, K, CL, CO2, GLUCOSE, BUN, CREATININE, CALCIUM in the last 72 hours.  Studies/Results: [name] Contrast  08/17/2015  CLINICAL DATA:  Left frontal brain mass.  Preop for surgery. EXAM: MRI HEAD WITHOUT CONTRAST TECHNIQUE: Multiplanar, multiecho pulse sequences of the brain and surrounding structures were obtained without intravenous contrast. COMPARISON:  MRI brain 07/23/2015 and 05/08/2015. CT of the chest, abdomen, and pelvis 08/11/2015. FINDINGS: Measured on T2 weighted images, and the solid and cystic mass lesion in the anterior left frontal lobe is stable to slightly decreased in size, measuring 15 x 11 x 10 mm. No additional lesions are present. Surrounding vasogenic edema is noted. Skullbase is within normal limits. Midline sagittal images are within normal limits. Flow is present in the major intracranial arteries. The globes and orbits are intact. The paranasal sinuses and mastoid air cells are clear. IMPRESSION: 1. Stable to slight decrease in size of solid and cystic anterior left frontal lobe mass. Although no primary lesion was evident on the CT chest, abdomen, and pelvis, the metastatic lesion is still favored. A grade 3  astrocytoma is also considered. Electronically Signed   By: San Morelle M.D.   On: 08/17/2015 16:39    Assessment/Plan: The patient is doing well.  LOS: 0 days     Neco Kling D 08/17/2015, 8:00 PM

## 2015-08-17 NOTE — Anesthesia Preprocedure Evaluation (Signed)
Anesthesia Evaluation  Patient identified by MRN, date of birth, ID band Patient awake    Reviewed: Allergy & Precautions, NPO status , Patient's Chart, lab work & pertinent test results, reviewed documented beta blocker date and time   Airway Mallampati: II  TM Distance: >3 FB Neck ROM: Full    Dental no notable dental hx.    Pulmonary sleep apnea , former smoker,    Pulmonary exam normal breath sounds clear to auscultation       Cardiovascular Pt. on home beta blockers Normal cardiovascular exam+ dysrhythmias Supra Ventricular Tachycardia  Rhythm:Regular Rate:Normal     Neuro/Psych  Headaches, negative neurological ROS  negative psych ROS   GI/Hepatic Neg liver ROS, GERD  ,  Endo/Other  diabetes, Type 2  Renal/GU negative Renal ROS     Musculoskeletal negative musculoskeletal ROS (+) Arthritis ,   Abdominal (+) + obese,   Peds  Hematology negative hematology ROS (+)   Anesthesia Other Findings   Reproductive/Obstetrics negative OB ROS                             Anesthesia Physical Anesthesia Plan  ASA: III  Anesthesia Plan: General   Post-op Pain Management:    Induction: Intravenous  Airway Management Planned: Oral ETT  Additional Equipment:   Intra-op Plan:   Post-operative Plan: Extubation in OR  Informed Consent: I have reviewed the patients History and Physical, chart, labs and discussed the procedure including the risks, benefits and alternatives for the proposed anesthesia with the patient or authorized representative who has indicated his/her understanding and acceptance.   Dental advisory given  Plan Discussed with: CRNA  Anesthesia Plan Comments:         Anesthesia Quick Evaluation

## 2015-08-17 NOTE — Transfer of Care (Signed)
Immediate Anesthesia Transfer of Care Note  Patient: David Archer  Procedure(s) Performed: Procedure(s) with comments: STEREOTACTIC BRAIN BIOPSY WITH BRAINLAB NAVIGATION (Left) - Stereotactic brain biopsy with brainlab APPLICATION OF CRANIAL NAVIGATION (N/A)  Patient Location: PACU and SICU  Anesthesia Type:General  Level of Consciousness: awake  Airway & Oxygen Therapy: Patient Spontanous Breathing and Patient connected to nasal cannula oxygen  Post-op Assessment: Report given to RN and Post -op Vital signs reviewed and stable  Post vital signs: Reviewed and stable  Last Vitals:  Filed Vitals:   08/17/15 0833  BP: 154/90  Pulse: 72  Temp: 36.8 C  Resp: 18    Complications: No apparent anesthesia complications

## 2015-08-17 NOTE — Progress Notes (Signed)
Dr.Jenkins and Dr.Jackson notified of pt taking Mobic this am. Ok per both

## 2015-08-17 NOTE — H&P (Signed)
Subjective: The patient is a 65 year old white male who has a history of chronic headaches. He had a syncopal episode and was worked up with a brain MRI. This study demonstrated a small left frontal lesion. A follow-up MRI demonstrated that the lesion had enlarged. The patient was worked up further with a CT of the chest, abdomen, and pelvis which did not demonstrate any worrisome lesions. I discussed the situation with the patient. We discussed the various treatment options including a stereotactic brain biopsy. The patient has decided to proceed with surgery.   Past Medical History  Diagnosis Date  . Tachycardia   . Dysrhythmia     tachycardia  . Sleep apnea     back in 2000.  Hasn't been retested  . Diabetes mellitus without complication ([place])     "Pre" diet controlled   NO meds  . GERD (gastroesophageal reflux disease)     takes OTC MEDS  . Headache   . Arthritis     Past Surgical History  Procedure Laterality Date  . Colonoscopy    . Has never had      HAS NEVER HAD ANESTHESIA--NO SURGERIES    No Known Allergies  Social History  Substance Use Topics  . Smoking status: Former Smoker -- 1.00 packs/day for 70 years    Types: Cigarettes  . Smokeless tobacco: Former Systems developer    Quit date: 08/13/2008  . Alcohol Use: 16.2 oz/week    14 Glasses of wine, 1 Cans of beer, 12 Shots of liquor per week     Comment: SWITCHES BETWEEN BEVERAGES-- GOES IN SPELLS    History reviewed. No pertinent family history. Prior to Admission medications   Medication Sig Start Date End Date Taking? Authorizing Provider  cholecalciferol (VITAMIN D) 1000 units tablet Take 1,000 Units by mouth daily.   Yes Historical Provider, MD  fluticasone (FLONASE) 50 MCG/ACT nasal spray Place 2 sprays into both nostrils daily as needed for allergies or rhinitis.   Yes Historical Provider, MD  Glucosamine-Chondroit-Vit C-Mn (GLUCOSAMINE CHONDR 1500 COMPLX) CAPS Take 1 capsule by mouth 3 (three) times daily.   Yes  Historical Provider, MD  meloxicam (MOBIC) 15 MG tablet Take 15 mg by mouth daily.   Yes Historical Provider, MD  metFORMIN (GLUCOPHAGE) 500 MG tablet Take 500 mg by mouth daily with breakfast.   Yes Historical Provider, MD  metoprolol succinate (TOPROL-XL) 50 MG 24 hr tablet Take 50 mg by mouth daily. Take with or immediately following a meal.   Yes Historical Provider, MD  metoprolol tartrate (LOPRESSOR) 25 MG tablet Take 25 mg by mouth daily as needed (Heart palipations).   Yes Historical Provider, MD  Multiple Vitamins-Minerals (MULTIVITAMIN WITH MINERALS) tablet Take 1 tablet by mouth daily.   Yes Historical Provider, MD  nortriptyline (PAMELOR) 10 MG capsule Take 20 mg by mouth at bedtime.   Yes Historical Provider, MD  OMEGA 3-6-9 FATTY ACIDS PO Take 2 capsules by mouth daily. With Flaxseed oil   Yes Historical Provider, MD  Potassium 99 MG TABS Take 1 tablet by mouth daily.   Yes Historical Provider, MD  rosuvastatin (CRESTOR) 10 MG tablet Take 10 mg by mouth daily.   Yes Historical Provider, MD  cyclobenzaprine (FLEXERIL) 10 MG tablet Take 10 mg by mouth 2 (two) times daily as needed for muscle spasms.    Historical Provider, MD  loratadine (CLARITIN) 10 MG tablet Take 10 mg by mouth daily as needed for allergies.    Historical Provider, MD  Review of Systems  Positive ROS: As above  All other systems have been reviewed and were otherwise negative with the exception of those mentioned in the HPI and as above.  Objective: Vital signs in last 24 hours: Temp:  [98.3 F (36.8 C)] 98.3 F (36.8 C) (04/17 0833) Pulse Rate:  [72] 72 (04/17 0833) Resp:  [18] 18 (04/17 0833) BP: (154)/(90) 154/90 mmHg (04/17 0833) SpO2:  [99 %] 99 % (04/17 0833) Weight:  [109.6 kg (241 lb 10 oz)] 109.6 kg (241 lb 10 oz) (04/17 0833)  General Appearance: Alert, cooperative, no distress, Head: Normocephalic, without obvious abnormality, atraumatic Eyes: PERRL, conjunctiva/corneas clear, EOM's  intact,    Ears: Normal  Throat: Normal  Neck: Supple, symmetrical, trachea midline, no adenopathy; thyroid: No enlargement/tenderness/nodules; no carotid bruit or JVD Back: Symmetric, no curvature, ROM normal, no CVA tenderness Lungs: Clear to auscultation bilaterally, respirations unlabored Heart: Regular rate and rhythm, no murmur, rub or gallop Abdomen: Soft, non-tender,, no masses, no organomegaly Extremities: Extremities normal, atraumatic, no cyanosis or edema Pulses: 2+ and symmetric all extremities Skin: Skin color, texture, turgor normal, no rashes or lesions  NEUROLOGIC:   Mental status: alert and oriented, no aphasia, good attention span, Fund of knowledge/ memory ok Motor Exam - grossly normal Sensory Exam - grossly normal Reflexes:  Coordination - grossly normal Gait - grossly normal Balance - grossly normal Cranial Nerves: I: smell Not tested  II: visual acuity  OS: Normal  OD: Normal   II: visual fields Full to confrontation  II: pupils Equal, round, reactive to light  III,VII: ptosis None  III,IV,VI: extraocular muscles  Full ROM  V: mastication Normal  V: facial light touch sensation  Normal  V,VII: corneal reflex  Present  VII: facial muscle function - upper  Normal  VII: facial muscle function - lower Normal  VIII: hearing Not tested  IX: soft palate elevation  Normal  IX,X: gag reflex Present  XI: trapezius strength  5/5  XI: sternocleidomastoid strength 5/5  XI: neck flexion strength  5/5  XII: tongue strength  Normal    Data Review Lab Results  Component Value Date   WBC 7.1 08/14/2015   HGB 15.7 08/14/2015   HCT 45.6 08/14/2015   MCV 92.5 08/14/2015   PLT 231 08/14/2015   Lab Results  Component Value Date   NA 136 08/14/2015   K 4.0 08/14/2015   CL 102 08/14/2015   CO2 24 08/14/2015   BUN 15 08/14/2015   CREATININE 0.83 08/14/2015   GLUCOSE 117* 08/14/2015   No results found for: INR, PROTIME  Assessment/Plan: Left frontal tumor:  I have discussed the situation with the patient and his wife and reviewed his MRI scans with him. We have discussed the various treatment options including continued observation versus stereotactic brain biopsy. I described the surgery to him. We discussed the risks, benefits, alternatives, and likelihood of achieving our goals with surgery. I have answered all the patient's, and his wife's, questions. He has decided to proceed with surgery.   Brylei Pedley D 08/17/2015 5:35 PM

## 2015-08-17 NOTE — Anesthesia Postprocedure Evaluation (Signed)
Anesthesia Post Note  Patient: David Archer  Procedure(s) Performed: Procedure(s) (LRB): STEREOTACTIC BRAIN BIOPSY WITH BRAINLAB NAVIGATION (Left) APPLICATION OF CRANIAL NAVIGATION (N/A)  Patient location during evaluation: PACU Anesthesia Type: General Level of consciousness: awake and alert Pain management: pain level controlled Vital Signs Assessment: post-procedure vital signs reviewed and stable Respiratory status: spontaneous breathing, nonlabored ventilation and respiratory function stable Cardiovascular status: blood pressure returned to baseline and stable Postop Assessment: no signs of nausea or vomiting Anesthetic complications: no Comments: Pt in narrow complex tachycardia with HR 140-160. Given '50mg'$  Esmolol, '5mg'$  Lopressor with no response. Given '6mg'$  then '12mg'$  Adenosine which slowed HR to reveal what looks like afib. Cardiology consulted for afib w/RVR.    Last Vitals:  Filed Vitals:   08/17/15 2100 08/17/15 2115  BP: 112/89 120/80  Pulse: 132   Temp: 36.4 C   Resp: 19 17    Last Pain: There were no vitals filed for this visit.               Iman Orourke,W. EDMOND

## 2015-08-18 ENCOUNTER — Inpatient Hospital Stay (HOSPITAL_COMMUNITY): Payer: BLUE CROSS/BLUE SHIELD

## 2015-08-18 ENCOUNTER — Encounter (HOSPITAL_COMMUNITY): Payer: Self-pay | Admitting: Radiology

## 2015-08-18 DIAGNOSIS — G4730 Sleep apnea, unspecified: Secondary | ICD-10-CM | POA: Diagnosis present

## 2015-08-18 DIAGNOSIS — I4891 Unspecified atrial fibrillation: Secondary | ICD-10-CM

## 2015-08-18 DIAGNOSIS — E119 Type 2 diabetes mellitus without complications: Secondary | ICD-10-CM

## 2015-08-18 DIAGNOSIS — I480 Paroxysmal atrial fibrillation: Secondary | ICD-10-CM

## 2015-08-18 LAB — GLUCOSE, CAPILLARY
GLUCOSE-CAPILLARY: 147 mg/dL — AB (ref 65–99)
GLUCOSE-CAPILLARY: 178 mg/dL — AB (ref 65–99)
Glucose-Capillary: 206 mg/dL — ABNORMAL HIGH (ref 65–99)
Glucose-Capillary: 183 mg/dL — ABNORMAL HIGH (ref 65–99)
Glucose-Capillary: 170 mg/dL — ABNORMAL HIGH (ref 65–99)

## 2015-08-18 LAB — MRSA PCR SCREENING: MRSA BY PCR: NEGATIVE

## 2015-08-18 LAB — TSH: TSH: 1.268 u[IU]/mL (ref 0.350–4.500)

## 2015-08-18 MED ORDER — METOPROLOL SUCCINATE ER 100 MG PO TB24
100.0000 mg | ORAL_TABLET | Freq: Every day | ORAL | Status: DC
Start: 2015-08-19 — End: 2015-08-20
  Administered 2015-08-19 – 2015-08-20 (×2): 100 mg via ORAL
  Filled 2015-08-18 (×2): qty 1

## 2015-08-18 MED ORDER — METOPROLOL SUCCINATE ER 25 MG PO TB24
50.0000 mg | ORAL_TABLET | Freq: Once | ORAL | Status: AC
  Administered 2015-08-18: 50 mg via ORAL
  Filled 2015-08-18: qty 2

## 2015-08-18 MED ORDER — MAGNESIUM SULFATE 50 % IJ SOLN
3.0000 g | Freq: Once | INTRAVENOUS | Status: DC
  Filled 2015-08-18: qty 6

## 2015-08-18 NOTE — Progress Notes (Signed)
Patient Name: David Archer Date of Encounter: 08/18/2015  Primary Cardiologist: Bartholome Bill  Patient profile: 75M history of SVT treated with long acting and PRN metoprol, OSA, HLD, diet controlled diabetes and an enlarging left frontal brain lesion (diagnosed during work-up for chronic headaches) s/p sterotactic brain biopsy who had post operative AF.   Principal Problem:   Atrial fibrillation (HCC) Active Problems:   S/P craniotomy   Brain tumor ([place])   Sleep apnea   Diabetes mellitus without complication ([place])    SUBJECTIVE  No cardiac awareness last night while in afib. Says he usually has cardiac awareness in SVT  CURRENT MEDS . docusate sodium  100 mg Oral BID  . insulin aspart  0-20 Units Subcutaneous 6 times per day  . LORazepam  1 mg Intravenous Once  . magnesium sulfate 1 - 4 g bolus IVPB  3 g Intravenous Once  . metoprolol succinate  50 mg Oral Daily  . nortriptyline  20 mg Oral QHS  . pantoprazole (PROTONIX) IV  40 mg Intravenous QHS  . rosuvastatin  10 mg Oral Daily    OBJECTIVE  Filed Vitals:   08/18/15 0800 08/18/15 0900 08/18/15 1000 08/18/15 1100  BP: 109/62 113/66 116/67 112/64  Pulse: 83 88 89 89  Temp: 97.8 F (36.6 C)     TempSrc: Oral     Resp: '18 20 20 18  '$ Height:      Weight:      SpO2: 96% 97% 98% 98%    Intake/Output Summary (Last 24 hours) at 08/18/15 1158 Last data filed at 08/18/15 1100  Gross per 24 hour  Intake 2769.34 ml  Output    950 ml  Net 1819.34 ml   Filed Weights   08/17/15 0833 08/17/15 2245  Weight: 241 lb 10 oz (109.6 kg) 236 lb 12.4 oz (107.4 kg)    PHYSICAL EXAM  General: Pleasant, NAD. Neuro: Alert and oriented X 3. Moves all extremities spontaneously. Psych: Normal affect. HEENT:  Normal. Dressing over L cranium  Neck: Supple without bruits or JVD. Lungs:  Resp regular and unlabored, CTA. Heart: RRR no s3, s4, or murmurs. Abdomen: Soft, non-tender, non-distended, BS + x 4.  Extremities: No  clubbing, cyanosis or edema. DP/PT/Radials 2+ and equal bilaterally.  Accessory Clinical Findings  Basic Metabolic Panel  Recent Labs  08/17/15 2228  NA 137  K 4.0  CL 105  CO2 21*  GLUCOSE 181*  BUN 18  CREATININE 0.79  CALCIUM 8.8*  MG 1.7   Thyroid Function Tests  Recent Labs  08/17/15 2228  TSH 1.268    TELE afib with RVR, converted around 2:45AM    ECG  No new EKG  Echocardiogram  pending    Radiology/Studies  Ct Head Wo Contrast  08/18/2015  CLINICAL DATA:  Brain tumor biopsy 08/17/2015 EXAM: CT HEAD WITHOUT CONTRAST TECHNIQUE: Contiguous axial images were obtained from the base of the skull through the vertex without intravenous contrast. COMPARISON:  MRI 08/17/2015 FINDINGS: Left frontal burr hole for biopsy. Left frontal lobe lesion contains a gas bubble from biopsy. Small amount of hemorrhage is present adjacent to the lesion. Small amount of white matter edema. Small amount of subdural gas in the left frontal lobe. Gas is also present in the scalp with associated soft tissue swelling in the scalp. Ventricle size normal. No shift of the midline structures. No acute ischemic infarct. IMPRESSION: Postop changes of left frontal lobe biopsy. There is gas and a small amount of hemorrhage  at the biopsy site. Electronically Signed   By: Franchot Gallo M.D.   On: 08/18/2015 07:09   Ct Chest W Contrast  08/12/2015  CLINICAL DATA:  Enlarging brain lesion. Evaluate for primary malignancy. EXAM: CT CHEST, ABDOMEN, AND PELVIS WITH CONTRAST TECHNIQUE: Multidetector CT imaging of the chest, abdomen and pelvis was performed following the standard protocol during bolus administration of intravenous contrast. CONTRAST:  100 cc Isovue 370 intravenous COMPARISON:  07/12/2005 abdominal CT FINDINGS: CT CHEST THORACIC INLET/BODY WALL: No acute abnormality. MEDIASTINUM: Normal heart size. No pericardial effusion. Atherosclerosis, including the coronary arteries. No acute vascular  abnormality. No adenopathy. LUNG WINDOWS: No suspicious pulmonary nodule.  No active disease OSSEOUS: No acute fracture. No suspicious lytic or blastic lesions. Diffuse thoracic ankylosis by osteophytes. CT ABDOMEN AND PELVIS Abdominal wall:  No contributory findings. Hepatobiliary: Simple 12 mm cyst in the low central liver. Hepatic steatosis. No evidence of solid mass.No evidence of biliary obstruction or stone. Pancreas: Unremarkable. Spleen: Unremarkable. Adrenals/Urinary Tract: Negative adrenals. No evidence of renal mass. Three left frontal calculi including a 18 mm stone in the renal pelvis. Unremarkable bladder. Reproductive:Symmetric prostate enlargement Stomach/Bowel: Extensive distal colonic diverticulosis. No appendicitis. Vascular/Lymphatic: No acute vascular abnormality. Chronic enlargement of lymph nodes in the deep liver drainage, likely reactive to chronic liver disease. Atherosclerosis with 24 mm diameter infrarenal aorta. Peritoneal: No ascites or pneumoperitoneum. Musculoskeletal: Ankylosing osteophytes throughout the thoracic spine and upper lumbar spine. There is advanced lumbar degenerative disc disease and facet arthropathy with at least moderate canal and high-grade bilateral foraminal stenosis at L4-5. Fused right sacroiliac joint. No erosion. IMPRESSION: 1. No evidence of primary malignancy or metastatic disease in the chest, abdomen, or pelvis. 2. Hepatic steatosis, left nephrolithiasis, and extensive colonic diverticulosis. 3. Spondyloarthropathy with diffuse spinal ankylosis. Superimposed advanced lumbar disc degeneration. Electronically Signed   By: Monte Fantasia M.D.   On: 08/12/2015 08:34   [name] Contrast  08/17/2015  CLINICAL DATA:  Left frontal brain mass.  Preop for surgery. EXAM: MRI HEAD WITHOUT CONTRAST TECHNIQUE: Multiplanar, multiecho pulse sequences of the brain and surrounding structures were obtained without intravenous contrast. COMPARISON:  MRI brain  07/23/2015 and 05/08/2015. CT of the chest, abdomen, and pelvis 08/11/2015. FINDINGS: Measured on T2 weighted images, and the solid and cystic mass lesion in the anterior left frontal lobe is stable to slightly decreased in size, measuring 15 x 11 x 10 mm. No additional lesions are present. Surrounding vasogenic edema is noted. Skullbase is within normal limits. Midline sagittal images are within normal limits. Flow is present in the major intracranial arteries. The globes and orbits are intact. The paranasal sinuses and mastoid air cells are clear. IMPRESSION: 1. Stable to slight decrease in size of solid and cystic anterior left frontal lobe mass. Although no primary lesion was evident on the CT chest, abdomen, and pelvis, the metastatic lesion is still favored. 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 Electronically Signed   By: San Morelle M.D.   On: 08/17/2015 16:39   Mr Jeri Cos SE Contrast  07/23/2015  CLINICAL DATA:  Continued surveillance abnormal LEFT frontal white matter abnormality. History of headache for 2 years, and 1 episode of imbalance while driving. EXAM: MRI HEAD WITHOUT AND WITH CONTRAST TECHNIQUE: Multiplanar, multiecho pulse sequences of the brain and surrounding structures were obtained without and with intravenous contrast. CONTRAST:  90m MULTIHANCE GADOBENATE DIMEGLUMINE 529 MG/ML IV SOLN COMPARISON:  05/08/2015. FINDINGS: No acute stroke, acute hemorrhage, hydrocephalus, or extra-axial fluid. Mild  atrophy. The previously noted area of a LEFT inferior frontal white matter signal abnormality has progressed over the 2 month interval. The lesion has enlarged, becomes centrally necrotic, and is surrounded by vasogenic edema. There is irregular peripheral enhancement. No restricted diffusion centrally within limits for assessment due to proximity to the skull base. The lesion measures 10 x 16 x 10 mm. No other similar abnormalities. No osseous findings.  Extracranial soft tissues  unremarkable. IMPRESSION: Enlarging LEFT frontal lesion, 10 x 16 x 10 mm with peripheral enhancement, central necrosis, and vasogenic edema. Solitary metastatic deposit is favored. Primary brain tumor or infection are other differential considerations. A call is in to the ordering provider. Electronically Signed   By: Staci Righter M.D.   On: 07/23/2015 10:53   Ct Abdomen Pelvis W Contrast  08/12/2015  CLINICAL DATA:  Enlarging brain lesion. Evaluate for primary malignancy. EXAM: CT CHEST, ABDOMEN, AND PELVIS WITH CONTRAST TECHNIQUE: Multidetector CT imaging of the chest, abdomen and pelvis was performed following the standard protocol during bolus administration of intravenous contrast. CONTRAST:  100 cc Isovue 370 intravenous COMPARISON:  07/12/2005 abdominal CT FINDINGS: CT CHEST THORACIC INLET/BODY WALL: No acute abnormality. MEDIASTINUM: Normal heart size. No pericardial effusion. Atherosclerosis, including the coronary arteries. No acute vascular abnormality. No adenopathy. LUNG WINDOWS: No suspicious pulmonary nodule.  No active disease OSSEOUS: No acute fracture. No suspicious lytic or blastic lesions. Diffuse thoracic ankylosis by osteophytes. CT ABDOMEN AND PELVIS Abdominal wall:  No contributory findings. Hepatobiliary: Simple 12 mm cyst in the low central liver. Hepatic steatosis. No evidence of solid mass.No evidence of biliary obstruction or stone. Pancreas: Unremarkable. Spleen: Unremarkable. Adrenals/Urinary Tract: Negative adrenals. No evidence of renal mass. Three left frontal calculi including a 18 mm stone in the renal pelvis. Unremarkable bladder. Reproductive:Symmetric prostate enlargement Stomach/Bowel: Extensive distal colonic diverticulosis. No appendicitis. Vascular/Lymphatic: No acute vascular abnormality. Chronic enlargement of lymph nodes in the deep liver drainage, likely reactive to chronic liver disease. Atherosclerosis with 24 mm diameter infrarenal aorta. Peritoneal: No ascites  or pneumoperitoneum. Musculoskeletal: Ankylosing osteophytes throughout the thoracic spine and upper lumbar spine. There is advanced lumbar degenerative disc disease and facet arthropathy with at least moderate canal and high-grade bilateral foraminal stenosis at L4-5. Fused right sacroiliac joint. No erosion. IMPRESSION: 1. No evidence of primary malignancy or metastatic disease in the chest, abdomen, or pelvis. 2. Hepatic steatosis, left nephrolithiasis, and extensive colonic diverticulosis. 3. Spondyloarthropathy with diffuse spinal ankylosis. Superimposed advanced lumbar disc degeneration. Electronically Signed   By: Monte Fantasia M.D.   On: 08/12/2015 08:34    ASSESSMENT AND PLAN  1. Post op afib  - review of telemetry last night initially with regular narrow complex tachycardia, after slowing down with IV BB and adenosine, noted to be in afib with RVR, later he had multiple episodes of converting back and forth between afib and NSR, last came out of afib around 2:45AM on 4/18. Has been maintaining NSR since.   - His CHADSVASC is 1 for DM (assuming normal LV function). He is not a candidate for anticoagulation anyways given brain biopsy.   - obtain Echo today. When ok with neurosurgery, start '81mg'$  Aspirin. D/C diltiazem drip. Change metoprolol XL '50mg'$  daily to '100mg'$  daily (additional '50mg'$  this morning). Ok to move to telemetry. Observe overnight, if no recurrence and echo looks good, expect discharge tomorrow.   2. Enlarging L frontal brain lesion s/p sterotactic brain biopsy 4/17  3. H/o SVT on metoprolol XL  4. HLD  5.  Diet controlled DM  6. OSA   Signed, Almyra Deforest PA-C Pager: [phone]  I have seen and examined the patient along with Almyra Deforest PA-C.  I have reviewed the chart, notes and new data.  I agree with PA's note.  Key new complaints: minimally symptomatic atrial fibrillation, spontaneously resolved, mild RVR Key examination changes: no overt signs of HF or other structural  heart disease   PLAN: Echo, beta blocker dose increased, no anticoagulation due to brain mass.  Sanda Klein, MD, [place] [phone] 08/18/2015, 1:03 PM

## 2015-08-18 NOTE — Progress Notes (Signed)
[facility] Progress Note Patient Name: David Archer DOB: March 02, 1951 MRN: [id number]   Date of Service  08/18/2015  HPI/Events of Note  Lt frontal brain tumor s/p stereotactic biopsy. Post op Afib.   eICU Interventions  Seen by cardiology. Appears to be in sinus now. Stable on camera. No interventions needed.      Intervention Category Evaluation Type: New Patient Evaluation  Avnoor Koury 08/18/2015, 3:59 AM

## 2015-08-18 NOTE — Progress Notes (Signed)
Pt Mag 1.7, K 4.0. Notified Cards Dr. Tommi Rumps. Orders given for Mag IVPB. Will administer.

## 2015-08-18 NOTE — Progress Notes (Signed)
Pt received at this time alert, verbal with no noted distress. Biopsy dressing site to left side of head dry and intact. Pt denies pain or discomfort at this time. Pt oriented to room. Safety measures in place. Wife at bedside. Call bell within reach. Will continue to monitor.

## 2015-08-18 NOTE — Progress Notes (Signed)
[name], male   DOB: 11/17/1950, 65 y.o.   MRN: [id number] Subjective:  The patient is alert and pleasant. He looks well. He has no complaints. He wants to go home.  Objective: Vital signs in last 24 hours: Temp:  [97.6 F (36.4 C)-98.3 F (36.8 C)] 97.6 F (36.4 C) (04/18 0400) Pulse Rate:  [51-171] 82 (04/18 0730) Resp:  [11-25] 18 (04/18 0730) BP: (92-154)/(50-102) 108/65 mmHg (04/18 0730) SpO2:  [90 %-99 %] 95 % (04/18 0730) Weight:  [107.4 kg (236 lb 12.4 oz)-109.6 kg (241 lb 10 oz)] 107.4 kg (236 lb 12.4 oz) (04/17 2245)  Intake/Output from previous day: 04/17 0701 - 04/18 0700 In: 2449.3 [P.O.:120; I.V.:2023.3; IV Piggyback:306] Out: 450 [Urine:400; Blood:50] Intake/Output this shift:    Physical exam the patient is alert and pleasant. He is oriented 3. Glasgow Coma Scale 15. His speech is normal. His strength is normal. His dressing is clean and dry.  The patient's postoperative scan looks good.  Lab Results: No results for input(s): WBC, HGB, HCT, PLT in the last 72 hours. BMET  Recent Labs  08/17/15 2228  NA 137  K 4.0  CL 105  CO2 21*  GLUCOSE 181*  BUN 18  CREATININE 0.79  CALCIUM 8.8*    Studies/Results: Ct Head Wo Contrast  08/18/2015  CLINICAL DATA:  Brain tumor biopsy 08/17/2015 EXAM: CT HEAD WITHOUT CONTRAST TECHNIQUE: Contiguous axial images were obtained from the base of the skull through the vertex without intravenous contrast. COMPARISON:  MRI 08/17/2015 FINDINGS: Left frontal burr hole for biopsy. Left frontal lobe lesion contains a gas bubble from biopsy. Small amount of hemorrhage is present adjacent to the lesion. Small amount of white matter edema. Small amount of subdural gas in the left frontal lobe. Gas is also present in the scalp with associated soft tissue swelling in the scalp. Ventricle size normal. No shift of the midline structures. No acute ischemic infarct. IMPRESSION: Postop changes of left frontal lobe biopsy.  There is gas and a small amount of hemorrhage at the biopsy site. Electronically Signed   By: Franchot Gallo M.Archer.   On: 08/18/2015 07:09   [name] Contrast  08/17/2015  CLINICAL DATA:  Left frontal brain mass.  Preop for surgery. EXAM: MRI HEAD WITHOUT CONTRAST TECHNIQUE: Multiplanar, multiecho pulse sequences of the brain and surrounding structures were obtained without intravenous contrast. COMPARISON:  MRI brain 07/23/2015 and 05/08/2015. CT of the chest, abdomen, and pelvis 08/11/2015. FINDINGS: Measured on T2 weighted images, and the solid and cystic mass lesion in the anterior left frontal lobe is stable to slightly decreased in size, measuring 15 x 11 x 10 mm. No additional lesions are present. Surrounding vasogenic edema is noted. Skullbase is within normal limits. Midline sagittal images are within normal limits. Flow is present in the major intracranial arteries. The globes and orbits are intact. The paranasal sinuses and mastoid air cells are clear. IMPRESSION: 1. Stable to slight decrease in size of solid and cystic anterior left frontal lobe mass. Although no primary lesion was evident on the CT chest, abdomen, and pelvis, the metastatic lesion is still favored. 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 Electronically Signed   By: San Morelle M.Archer.   On: 08/17/2015 16:39    Assessment/Plan: Postop day #1: The patient is doing well neurologically.  Atrial fibrillation: I appreciate Dr. Shanda Bumps, et al, help.  From my point of view the patient can be discharged today. I will await further  cardiology input as far as when he can be discharged.  LOS: 1 day     David Archer 08/18/2015, 7:55 AM

## 2015-08-19 ENCOUNTER — Encounter (HOSPITAL_COMMUNITY): Payer: Self-pay | Admitting: Neurosurgery

## 2015-08-19 ENCOUNTER — Inpatient Hospital Stay (HOSPITAL_COMMUNITY): Payer: BLUE CROSS/BLUE SHIELD

## 2015-08-19 DIAGNOSIS — I4891 Unspecified atrial fibrillation: Secondary | ICD-10-CM

## 2015-08-19 DIAGNOSIS — Z9889 Other specified postprocedural states: Secondary | ICD-10-CM

## 2015-08-19 LAB — GLUCOSE, CAPILLARY
GLUCOSE-CAPILLARY: 123 mg/dL — AB (ref 65–99)
GLUCOSE-CAPILLARY: 127 mg/dL — AB (ref 65–99)
GLUCOSE-CAPILLARY: 131 mg/dL — AB (ref 65–99)
Glucose-Capillary: 121 mg/dL — ABNORMAL HIGH (ref 65–99)
Glucose-Capillary: 136 mg/dL — ABNORMAL HIGH (ref 65–99)
Glucose-Capillary: 128 mg/dL — ABNORMAL HIGH (ref 65–99)

## 2015-08-19 LAB — ECHOCARDIOGRAM COMPLETE
Height: 71 in
Weight: 3788.38 oz

## 2015-08-19 MED ORDER — ASPIRIN 81 MG PO CHEW
81.0000 mg | CHEWABLE_TABLET | Freq: Once | ORAL | Status: AC
  Administered 2015-08-19: 81 mg via ORAL
  Filled 2015-08-19: qty 1

## 2015-08-19 MED ORDER — DIGOXIN 0.25 MG/ML IJ SOLN: 0.2500 mg | Freq: Once | INTRAMUSCULAR | Status: DC

## 2015-08-19 MED ORDER — DILTIAZEM HCL 30 MG PO TABS
60.0000 mg | ORAL_TABLET | Freq: Three times a day (TID) | ORAL | Status: DC
  Administered 2015-08-19: 60 mg via ORAL
  Filled 2015-08-19: qty 2

## 2015-08-19 MED ORDER — PANTOPRAZOLE SODIUM 40 MG PO TBEC
40.0000 mg | DELAYED_RELEASE_TABLET | Freq: Every day | ORAL | Status: DC
  Administered 2015-08-19: 40 mg via ORAL
  Filled 2015-08-19: qty 1

## 2015-08-19 MED ORDER — DILTIAZEM HCL 90 MG PO TABS
120.0000 mg | ORAL_TABLET | Freq: Three times a day (TID) | ORAL | Status: DC
  Administered 2015-08-19 – 2015-08-20 (×2): 120 mg via ORAL
  Filled 2015-08-19 (×2): qty 1

## 2015-08-19 NOTE — Progress Notes (Signed)
  Echocardiogram 2D Echocardiogram has been performed.  Jennette Dubin 08/19/2015, 1:10 PM

## 2015-08-19 NOTE — Progress Notes (Signed)
  Echocardiogram 2D Echocardiogram has been performed.  Jennette Dubin 08/19/2015, 1:11 PM

## 2015-08-19 NOTE — Care Management Note (Signed)
Case Management Note  Patient Details  Name: David Archer MRN: [id number] Date of Birth: March 13, 1951  Subjective/Objective:                    Action/Plan: Pt s/p brain biopsy and now in afib. CM following for discharge needs.   Expected Discharge Date:                  Expected Discharge Plan:     In-House Referral:     Discharge planning Services     Post Acute Care Choice:    Choice offered to:     DME Arranged:    DME Agency:     HH Arranged:    HH Agency:     Status of Service:  In process, will continue to follow  Medicare Important Message Given:    Date Medicare IM Given:    Medicare IM give by:    Date Additional Medicare IM Given:    Additional Medicare Important Message give by:     If discussed at [place] of Stay Meetings, dates discussed:    Additional Comments:  Pollie Friar, RN 08/19/2015, 3:17 PM

## 2015-08-19 NOTE — Progress Notes (Signed)
New order for cardiac monitoring. Pt on telemetry monitoring at this time. Pt denies pain or discomfort. No noted distress. Will continue to monitor.

## 2015-08-19 NOTE — Progress Notes (Signed)
Md aware of elevated HR. Pt to receive  Cardizem 120 mg per Md order. Pt remains on telemetry monitoring. No noted distress. Denies pain or discomfort. Will continue to monitor.

## 2015-08-19 NOTE — Progress Notes (Signed)
Patient Name: David Archer Date of Encounter: 08/19/2015  Primary Cardiologist: Dr. Ubaldo Glassing in [place]   Patient profile: 62M history of SVT treated with long acting and PRN metoprol, OSA, HLD, diet controlled diabetes and an enlarging left frontal brain lesion (diagnosed during work-up for chronic headaches) s/p sterotactic brain biopsy who had post operative AF.    Principal Problem:   Atrial fibrillation (HCC) Active Problems:   S/P craniotomy   Brain tumor ([place])   Sleep apnea   Diabetes mellitus without complication (HCC)    SUBJECTIVE  Feeling well overnight.   CURRENT MEDS . diltiazem  60 mg Oral Q8H  . docusate sodium  100 mg Oral BID  . insulin aspart  0-20 Units Subcutaneous 6 times per day  . LORazepam  1 mg Intravenous Once  . magnesium sulfate 1 - 4 g bolus IVPB  3 g Intravenous Once  . metoprolol succinate  100 mg Oral Daily  . nortriptyline  20 mg Oral QHS  . pantoprazole (PROTONIX) IV  40 mg Intravenous QHS  . rosuvastatin  10 mg Oral Daily    OBJECTIVE  Filed Vitals:   08/18/15 2113 08/19/15 0115 08/19/15 0455 08/19/15 0959  BP: 148/77 131/74 131/75 155/92  Pulse: 85 88 94 69  Temp: 97.5 F (36.4 C) 98.4 F (36.9 C) 98.9 F (37.2 C) 99.1 F (37.3 C)  TempSrc: Oral Oral Oral Oral  Resp: '16 16 16 16  '$ Height:      Weight:      SpO2: 96% 98% 94% 92%    Intake/Output Summary (Last 24 hours) at 08/19/15 1156 Last data filed at 08/18/15 1400  Gross per 24 hour  Intake    350 ml  Output      0 ml  Net    350 ml   Filed Weights   08/17/15 0833 08/17/15 2245  Weight: 241 lb 10 oz (109.6 kg) 236 lb 12.4 oz (107.4 kg)    PHYSICAL EXAM  General: Pleasant, NAD. Neuro: Alert and oriented X 3. Moves all extremities spontaneously. Psych: Normal affect. HEENT:  Normal  Neck: Supple without bruits or JVD. Lungs:  Resp regular and unlabored, CTA. Heart: RRR no s3, s4, or murmurs. Abdomen: Soft, non-tender, non-distended, BS + x 4.    Extremities: No clubbing, cyanosis or edema. DP/PT/Radials 2+ and equal bilaterally.  Accessory Clinical Findings  CBC No results for input(s): WBC, NEUTROABS, HGB, HCT, MCV, PLT in the last 72 hours. Basic Metabolic Panel  Recent Labs  08/17/15 2228  NA 137  K 4.0  CL 105  CO2 21*  GLUCOSE 181*  BUN 18  CREATININE 0.79  CALCIUM 8.8*  MG 1.7   Thyroid Function Tests  Recent Labs  08/17/15 2228  TSH 1.268    TELE afib with RVR    ECG  No new EKG  Echocardiogram  pending    Radiology/Studies  Ct Head Wo Contrast  08/18/2015  CLINICAL DATA:  Brain tumor biopsy 08/17/2015 EXAM: CT HEAD WITHOUT CONTRAST TECHNIQUE: Contiguous axial images were obtained from the base of the skull through the vertex without intravenous contrast. COMPARISON:  MRI 08/17/2015 FINDINGS: Left frontal burr hole for biopsy. Left frontal lobe lesion contains a gas bubble from biopsy. Small amount of hemorrhage is present adjacent to the lesion. Small amount of white matter edema. Small amount of subdural gas in the left frontal lobe. Gas is also present in the scalp with associated soft tissue swelling in the scalp. Ventricle size normal.  No shift of the midline structures. No acute ischemic infarct. IMPRESSION: Postop changes of left frontal lobe biopsy. There is gas and a small amount of hemorrhage at the biopsy site. Electronically Signed   By: Franchot Gallo M.D.   On: 08/18/2015 07:09   Ct Chest W Contrast  08/12/2015  CLINICAL DATA:  Enlarging brain lesion. Evaluate for primary malignancy. EXAM: CT CHEST, ABDOMEN, AND PELVIS WITH CONTRAST TECHNIQUE: Multidetector CT imaging of the chest, abdomen and pelvis was performed following the standard protocol during bolus administration of intravenous contrast. CONTRAST:  100 cc Isovue 370 intravenous COMPARISON:  07/12/2005 abdominal CT FINDINGS: CT CHEST THORACIC INLET/BODY WALL: No acute abnormality. MEDIASTINUM: Normal heart size. No pericardial  effusion. Atherosclerosis, including the coronary arteries. No acute vascular abnormality. No adenopathy. LUNG WINDOWS: No suspicious pulmonary nodule.  No active disease OSSEOUS: No acute fracture. No suspicious lytic or blastic lesions. Diffuse thoracic ankylosis by osteophytes. CT ABDOMEN AND PELVIS Abdominal wall:  No contributory findings. Hepatobiliary: Simple 12 mm cyst in the low central liver. Hepatic steatosis. No evidence of solid mass.No evidence of biliary obstruction or stone. Pancreas: Unremarkable. Spleen: Unremarkable. Adrenals/Urinary Tract: Negative adrenals. No evidence of renal mass. Three left frontal calculi including a 18 mm stone in the renal pelvis. Unremarkable bladder. Reproductive:Symmetric prostate enlargement Stomach/Bowel: Extensive distal colonic diverticulosis. No appendicitis. Vascular/Lymphatic: No acute vascular abnormality. Chronic enlargement of lymph nodes in the deep liver drainage, likely reactive to chronic liver disease. Atherosclerosis with 24 mm diameter infrarenal aorta. Peritoneal: No ascites or pneumoperitoneum. Musculoskeletal: Ankylosing osteophytes throughout the thoracic spine and upper lumbar spine. There is advanced lumbar degenerative disc disease and facet arthropathy with at least moderate canal and high-grade bilateral foraminal stenosis at L4-5. Fused right sacroiliac joint. No erosion. IMPRESSION: 1. No evidence of primary malignancy or metastatic disease in the chest, abdomen, or pelvis. 2. Hepatic steatosis, left nephrolithiasis, and extensive colonic diverticulosis. 3. Spondyloarthropathy with diffuse spinal ankylosis. Superimposed advanced lumbar disc degeneration. Electronically Signed   By: Monte Fantasia M.D.   On: 08/12/2015 08:34   [name] Contrast  08/17/2015  CLINICAL DATA:  Left frontal brain mass.  Preop for surgery. EXAM: MRI HEAD WITHOUT CONTRAST TECHNIQUE: Multiplanar, multiecho pulse sequences of the brain and surrounding  structures were obtained without intravenous contrast. COMPARISON:  MRI brain 07/23/2015 and 05/08/2015. CT of the chest, abdomen, and pelvis 08/11/2015. FINDINGS: Measured on T2 weighted images, and the solid and cystic mass lesion in the anterior left frontal lobe is stable to slightly decreased in size, measuring 15 x 11 x 10 mm. No additional lesions are present. Surrounding vasogenic edema is noted. Skullbase is within normal limits. Midline sagittal images are within normal limits. Flow is present in the major intracranial arteries. The globes and orbits are intact. The paranasal sinuses and mastoid air cells are clear. IMPRESSION: 1. Stable to slight decrease in size of solid and cystic anterior left frontal lobe mass. Although no primary lesion was evident on the CT chest, abdomen, and pelvis, the metastatic lesion is still favored. 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 Electronically Signed   By: San Morelle M.D.   On: 08/17/2015 16:39   Mr Jeri Cos XB Contrast  07/23/2015  CLINICAL DATA:  Continued surveillance abnormal LEFT frontal white matter abnormality. History of headache for 2 years, and 1 episode of imbalance while driving. EXAM: MRI HEAD WITHOUT AND WITH CONTRAST TECHNIQUE: Multiplanar, multiecho pulse sequences of the brain and surrounding structures were obtained without and  with intravenous contrast. CONTRAST:  76m MULTIHANCE GADOBENATE DIMEGLUMINE 529 MG/ML IV SOLN COMPARISON:  05/08/2015. FINDINGS: No acute stroke, acute hemorrhage, hydrocephalus, or extra-axial fluid. Mild atrophy. The previously noted area of a LEFT inferior frontal white matter signal abnormality has progressed over the 2 month interval. The lesion has enlarged, becomes centrally necrotic, and is surrounded by vasogenic edema. There is irregular peripheral enhancement. No restricted diffusion centrally within limits for assessment due to proximity to the skull base. The lesion measures 10 x 16 x 10 mm. No  other similar abnormalities. No osseous findings.  Extracranial soft tissues unremarkable. IMPRESSION: Enlarging LEFT frontal lesion, 10 x 16 x 10 mm with peripheral enhancement, central necrosis, and vasogenic edema. Solitary metastatic deposit is favored. Primary brain tumor or infection are other differential considerations. A call is in to the ordering provider. Electronically Signed   By: JStaci RighterM.D.   On: 07/23/2015 10:53   Ct Abdomen Pelvis W Contrast  08/12/2015  CLINICAL DATA:  Enlarging brain lesion. Evaluate for primary malignancy. EXAM: CT CHEST, ABDOMEN, AND PELVIS WITH CONTRAST TECHNIQUE: Multidetector CT imaging of the chest, abdomen and pelvis was performed following the standard protocol during bolus administration of intravenous contrast. CONTRAST:  100 cc Isovue 370 intravenous COMPARISON:  07/12/2005 abdominal CT FINDINGS: CT CHEST THORACIC INLET/BODY WALL: No acute abnormality. MEDIASTINUM: Normal heart size. No pericardial effusion. Atherosclerosis, including the coronary arteries. No acute vascular abnormality. No adenopathy. LUNG WINDOWS: No suspicious pulmonary nodule.  No active disease OSSEOUS: No acute fracture. No suspicious lytic or blastic lesions. Diffuse thoracic ankylosis by osteophytes. CT ABDOMEN AND PELVIS Abdominal wall:  No contributory findings. Hepatobiliary: Simple 12 mm cyst in the low central liver. Hepatic steatosis. No evidence of solid mass.No evidence of biliary obstruction or stone. Pancreas: Unremarkable. Spleen: Unremarkable. Adrenals/Urinary Tract: Negative adrenals. No evidence of renal mass. Three left frontal calculi including a 18 mm stone in the renal pelvis. Unremarkable bladder. Reproductive:Symmetric prostate enlargement Stomach/Bowel: Extensive distal colonic diverticulosis. No appendicitis. Vascular/Lymphatic: No acute vascular abnormality. Chronic enlargement of lymph nodes in the deep liver drainage, likely reactive to chronic liver disease.  Atherosclerosis with 24 mm diameter infrarenal aorta. Peritoneal: No ascites or pneumoperitoneum. Musculoskeletal: Ankylosing osteophytes throughout the thoracic spine and upper lumbar spine. There is advanced lumbar degenerative disc disease and facet arthropathy with at least moderate canal and high-grade bilateral foraminal stenosis at L4-5. Fused right sacroiliac joint. No erosion. IMPRESSION: 1. No evidence of primary malignancy or metastatic disease in the chest, abdomen, or pelvis. 2. Hepatic steatosis, left nephrolithiasis, and extensive colonic diverticulosis. 3. Spondyloarthropathy with diffuse spinal ankylosis. Superimposed advanced lumbar disc degeneration. Electronically Signed   By: JMonte FantasiaM.D.   On: 08/12/2015 08:34    ASSESSMENT AND PLAN  1. Post op afib - review of telemetry last night initially with regular narrow complex tachycardia, after slowing down with IV BB and adenosine, noted to be in afib with RVR, later he had multiple episodes of converting back and forth between afib and NSR, last came out of afib around 2:45AM on 4/18. Has been maintaining NSR since.  - His CHADSVASC is 1 for DM (assuming normal LV function). He is not a candidate for anticoagulation anyways given brain biopsy.  - not on telemetry overnight, noted to be in irregular tachycardia by Dr. CSallyanne Kustertoday, placed on telemetry, HR 150s, back in afib with RVR overnight. obtain Echo today. Start on '60mg'$  q8Hr short acting diltiazem. May consider digoxin as well  2. Enlarging  L frontal brain lesion s/p sterotactic brain biopsy 4/17  3. H/o SVT on metoprolol XL  4. HLD  5. Diet controlled DM  6. OSA   Signed, Almyra Deforest PA-C Pager: [phone]  I have seen and examined the patient along with Almyra Deforest PA-C.  I have reviewed the chart, notes and new data.  I agree with PA's note.  Key new complaints: barely aware of irregularity in heart beat Key examination  changes: rapid irregular heart rhythm Key new findings / data: was not on telemetry overnight. Currently in atrial fibrillation with RVR, up to 170 bpm. At times the rhythm appears to organize to atrial flutter with 2:1 AV block, 147 bpm. Echo pending  PLAN: Needs better rate control. Add diltiazem to metoprolol. Unable to anticoagulate. Review echo.  Sanda Klein, MD, [place] [phone] 08/19/2015, 12:24 PM

## 2015-08-19 NOTE — Progress Notes (Signed)
Left message for Vascular department in reference to Echo per pt request.

## 2015-08-19 NOTE — Discharge Summary (Signed)
Physician Discharge Summary  [name] MRN: [id number] DOB/AGE: March 07, 1951 65 y.o.  Admit date: 08/17/2015 Discharge date: 08/19/2015  Admission Diagnoses: Left frontal brain tumor  Discharge Diagnoses: Left frontal brain tumor, atrial fibrillation Principal Problem:   Atrial fibrillation ([place]) Active Problems:   S/P craniotomy   Brain tumor [facility])   Sleep apnea   Diabetes mellitus without complication [facility])   Discharged Condition: good  Hospital Course: I performed a stereotactic biopsy of the patient's left frontal brain lesion on 08/17/2015. The surgery went well. His postoperative scan looks good.  The patient's postoperative course was remarkable for a bout of atrial fibrillation. This was treated with medications and resolved. Cardiology has seen the patient. We are awaiting a echocardiogram.  The patient has requested discharge to home. The patient was given oral and written discharge instructions. He will follow-up with me in a week to have his staples removed. We will discharge the patient home when he is cleared by cardiology. He will follow-up with his cardiologist, Dr.fath as an outpatient.   Consults: Cardiology  Significant Diagnostic Studies:Echocardiogram  Treatments:Stereotactic brain biopsy  Discharge Exam: Blood pressure 155/92, pulse 69, temperature 99.1 F (37.3 C), temperature source Oral, resp. rate 16, height '5\' 11"'$  (1.803 m), weight 107.4 kg (236 lb 12.4 oz), SpO2 92 %. The patient is alert and oriented. His speech is normal. His strength is normal. His dressing is clean and dry.  Disposition: Home     Medication List    ASK your doctor about these medications        cholecalciferol 1000 units tablet  Commonly known as:  VITAMIN D  Take 1,000 Units by mouth daily.     cyclobenzaprine 10 MG tablet  Commonly known as:  FLEXERIL  Take 10 mg by mouth 2 (two) times daily as needed for muscle spasms.     fluticasone 50 MCG/ACT  nasal spray  Commonly known as:  FLONASE  Place 2 sprays into both nostrils daily as needed for allergies or rhinitis.     GLUCOSAMINE CHONDR 1500 COMPLX Caps  Take 1 capsule by mouth 3 (three) times daily.     loratadine 10 MG tablet  Commonly known as:  CLARITIN  Take 10 mg by mouth daily as needed for allergies.     meloxicam 15 MG tablet  Commonly known as:  MOBIC  Take 15 mg by mouth daily.     metFORMIN 500 MG tablet  Commonly known as:  GLUCOPHAGE  Take 500 mg by mouth daily with breakfast.     metoprolol succinate 50 MG 24 hr tablet  Commonly known as:  TOPROL-XL  Take 50 mg by mouth daily. Take with or immediately following a meal.     metoprolol tartrate 25 MG tablet  Commonly known as:  LOPRESSOR  Take 25 mg by mouth daily as needed (Heart palipations).     multivitamin with minerals tablet  Take 1 tablet by mouth daily.     nortriptyline 10 MG capsule  Commonly known as:  PAMELOR  Take 20 mg by mouth at bedtime.     OMEGA 3-6-9 FATTY ACIDS PO  Take 2 capsules by mouth daily. With Flaxseed oil     Potassium 99 MG Tabs  Take 1 tablet by mouth daily.     rosuvastatin 10 MG tablet  Commonly known as:  CRESTOR  Take 10 mg by mouth daily.         SignedOphelia Charter 08/19/2015, 10:53 AM

## 2015-08-20 LAB — GLUCOSE, CAPILLARY
GLUCOSE-CAPILLARY: 119 mg/dL — AB (ref 65–99)
GLUCOSE-CAPILLARY: 126 mg/dL — AB (ref 65–99)
GLUCOSE-CAPILLARY: 123 mg/dL — AB (ref 65–99)
Glucose-Capillary: 143 mg/dL — ABNORMAL HIGH (ref 65–99)

## 2015-08-20 MED ORDER — DILTIAZEM HCL ER COATED BEADS 180 MG PO CP24
360.0000 mg | ORAL_CAPSULE | Freq: Every day | ORAL | Status: DC
  Administered 2015-08-20: 360 mg via ORAL
  Filled 2015-08-20: qty 2

## 2015-08-20 MED ORDER — METOPROLOL SUCCINATE ER 100 MG PO TB24: 100.0000 mg | ORAL_TABLET | Freq: Every day | ORAL | Status: AC

## 2015-08-20 MED ORDER — DILTIAZEM HCL ER COATED BEADS 360 MG PO CP24: 360.0000 mg | ORAL_CAPSULE | Freq: Every day | ORAL | Status: DC

## 2015-08-20 MED ORDER — DILTIAZEM HCL ER COATED BEADS 120 MG PO TB24
360.0000 mg | ORAL_TABLET | Freq: Every day | ORAL | Status: DC
Start: 2015-08-20 — End: 2015-08-20
  Filled 2015-08-20: qty 3

## 2015-08-20 MED ORDER — HYDROCODONE-ACETAMINOPHEN 5-325 MG PO TABS: 1.0000 | ORAL_TABLET | ORAL | Status: DC | PRN

## 2015-08-20 NOTE — Progress Notes (Signed)
Pt discharging with his wife taking all personal belongings. Biopsy site clean, dry and intact. IV discontinued, dry dressing applied. Discharge instructions provided with prescription with verbal understanding. Pt denies pain or discomfort. No noted distress.

## 2015-08-20 NOTE — Discharge Summary (Signed)
Physician Discharge Summary  [name] MRN: [id number] DOB/AGE: 01-13-1951 65 y.o.  Admit date: 08/17/2015 Discharge date: 08/20/2015  Admission Diagnoses:Left frontal brain tumor, atrial fibrillation  Discharge Diagnoses: The same Principal Problem:   Atrial fibrillation [facility]) Active Problems:   S/P craniotomy   Brain tumor [facility])   Sleep apnea   Diabetes mellitus without complication [facility])   Discharged Condition: good  Hospital Course: I performed a left frontal stereotactic brain biopsy on the patient on 08/17/2015. The patient did well and his postoperative scan looks good.  The patient's postoperative course was remarkable for atrial fibrillation. He was seen by the cardiologist. His metoprolol was increased and he was started on diltiazem. Arrangements were made for the patient to follow-up with Dr. Ubaldo Glassing, his cardiologist in [place].  The patient requested discharge home. The patient, and his wife, were given written and oral discharge instructions. I have answered all her questions.  Consults: Cardiology Significant Diagnostic Studies: Echocardiogram, head CT Treatments: Left frontal stereotactic brain biopsy Discharge Exam: Blood pressure 111/76, pulse 110, temperature 98.2 F (36.8 C), temperature source Oral, resp. rate 18, height '5\' 11"'$  (1.803 m), weight 107.4 kg (236 lb 12.4 oz), SpO2 94 %. The patient is alert and pleasant. He is neurologically normal. His dressing is clean and dry.  Disposition: Home  Discharge Instructions    Call MD for:  difficulty breathing, headache or visual disturbances    Complete by:  As directed      Call MD for:  extreme fatigue    Complete by:  As directed      Call MD for:  hives    Complete by:  As directed      Call MD for:  persistant dizziness or light-headedness    Complete by:  As directed      Call MD for:  persistant nausea and vomiting    Complete by:  As directed      Call MD for:  redness,  tenderness, or signs of infection (pain, swelling, redness, odor or green/yellow discharge around incision site)    Complete by:  As directed      Call MD for:  severe uncontrolled pain    Complete by:  As directed      Call MD for:  temperature >100.4    Complete by:  As directed      Diet - low sodium heart healthy    Complete by:  As directed      Discharge instructions    Complete by:  As directed   Call [phone] for a followup appointment. Take a stool softener while you are using pain medications.     Driving Restrictions    Complete by:  As directed   Do not drive for 2 weeks.     Increase activity slowly    Complete by:  As directed      Lifting restrictions    Complete by:  As directed   Do not lift more than 5 pounds. No excessive bending or twisting.     May shower / Bathe    Complete by:  As directed   He may shower after the pain she is removed 3 days after surgery. Leave the incision alone.     No dressing needed    Complete by:  As directed             Medication List    STOP taking these medications        metoprolol  tartrate 25 MG tablet  Commonly known as:  LOPRESSOR      TAKE these medications        cholecalciferol 1000 units tablet  Commonly known as:  VITAMIN D  Take 1,000 Units by mouth daily.     cyclobenzaprine 10 MG tablet  Commonly known as:  FLEXERIL  Take 10 mg by mouth 2 (two) times daily as needed for muscle spasms.     diltiazem 360 MG 24 hr capsule  Commonly known as:  CARDIZEM CD  Take 1 capsule (360 mg total) by mouth daily.     fluticasone 50 MCG/ACT nasal spray  Commonly known as:  FLONASE  Place 2 sprays into both nostrils daily as needed for allergies or rhinitis.     GLUCOSAMINE CHONDR 1500 COMPLX Caps  Take 1 capsule by mouth 3 (three) times daily.     HYDROcodone-acetaminophen 5-325 MG tablet  Commonly known as:  NORCO/VICODIN  Take 1 tablet by mouth every 4 (four) hours as needed for moderate pain.      loratadine 10 MG tablet  Commonly known as:  CLARITIN  Take 10 mg by mouth daily as needed for allergies.     meloxicam 15 MG tablet  Commonly known as:  MOBIC  Take 15 mg by mouth daily.     metFORMIN 500 MG tablet  Commonly known as:  GLUCOPHAGE  Take 500 mg by mouth daily with breakfast.     metoprolol succinate 100 MG 24 hr tablet  Commonly known as:  TOPROL-XL  Take 1 tablet (100 mg total) by mouth daily. Take with or immediately following a meal.     multivitamin with minerals tablet  Take 1 tablet by mouth daily.     nortriptyline 10 MG capsule  Commonly known as:  PAMELOR  Take 20 mg by mouth at bedtime.     OMEGA 3-6-9 FATTY ACIDS PO  Take 2 capsules by mouth daily. With Flaxseed oil     Potassium 99 MG Tabs  Take 1 tablet by mouth daily.     rosuvastatin 10 MG tablet  Commonly known as:  CRESTOR  Take 10 mg by mouth daily.         SignedOphelia Charter 08/20/2015, 12:32 PM

## 2015-08-20 NOTE — Progress Notes (Signed)
Patient Name: David Archer Date of Encounter: 08/20/2015  Principal Problem:   Atrial fibrillation [facility]) Active Problems:   S/P craniotomy   Brain tumor ([place])   Sleep apnea   Diabetes mellitus without complication ([place])   Length of Stay: 3  SUBJECTIVE  Atrial fibrillation has resolved. Rhythm is sinus, with very frequent brief bursts of atrial tachycardia, strongly suggestive of pulmonary vein tachycardia.  CURRENT MEDS . diltiazem  360 mg Oral Daily  . docusate sodium  100 mg Oral BID  . insulin aspart  0-20 Units Subcutaneous 6 times per day  . LORazepam  1 mg Intravenous Once  . magnesium sulfate 1 - 4 g bolus IVPB  3 g Intravenous Once  . metoprolol succinate  100 mg Oral Daily  . nortriptyline  20 mg Oral QHS  . pantoprazole  40 mg Oral QHS  . rosuvastatin  10 mg Oral Daily    OBJECTIVE  No intake or output data in the 24 hours ending 08/20/15 0852 Filed Weights   08/17/15 0833 08/17/15 2245  Weight: 109.6 kg (241 lb 10 oz) 107.4 kg (236 lb 12.4 oz)    PHYSICAL EXAM Filed Vitals:   08/19/15 2120 08/20/15 0115 08/20/15 0520 08/20/15 0821  BP: 103/73 118/63 107/54 127/62  Pulse: 160 112 51 61  Temp: 98.7 F (37.1 C) 98.1 F (36.7 C) 98.4 F (36.9 C) 98.2 F (36.8 C)  TempSrc: Oral Oral Oral Oral  Resp: '18 18 20 18  '$ Height:      Weight:      SpO2: 94% 94% 95% 96%   General: Alert, oriented x3, no distress Head: no evidence of trauma, PERRL, EOMI, no exophtalmos or lid lag, no myxedema, no xanthelasma; normal ears, nose and oropharynx Neck: normal jugular venous pulsations and no hepatojugular reflux; brisk carotid pulses without delay and no carotid bruits Chest: clear to auscultation, no signs of consolidation by percussion or palpation, normal fremitus, symmetrical and full respiratory excursions Cardiovascular: normal position and quality of the apical impulse, regular rhythm with very frequent ectopy, normal first and second heart sounds, no  rubs or gallops, no murmur Abdomen: no tenderness or distention, no masses by palpation, no abnormal pulsatility or arterial bruits, normal bowel sounds, no hepatosplenomegaly Extremities: no clubbing, cyanosis or edema; 2+ radial, ulnar and brachial pulses bilaterally; 2+ right femoral, posterior tibial and dorsalis pedis pulses; 2+ left femoral, posterior tibial and dorsalis pedis pulses; no subclavian or femoral bruits Neurological: grossly nonfocal  LABS  CBC No results for input(s): WBC, NEUTROABS, HGB, HCT, MCV, PLT in the last 72 hours. Basic Metabolic Panel  Recent Labs  08/17/15 2228  NA 137  K 4.0  CL 105  CO2 21*  GLUCOSE 181*  BUN 18  CREATININE 0.79  CALCIUM 8.8*  MG 1.7   Thyroid Function Tests  Recent Labs  08/17/15 2228  TSH 1.268    Radiology Studies Imaging results have been reviewed and No results found.  TELE SR with bursts of atrial tachycardia  ASSESSMENT AND PLAN  OK for DC from Cardiology point of view. Metoprolol suucinate 100 mg daily and diltiazem SR 360 mg daily. F/U with Dr. Ubaldo Glassing in [place]. Low threshold for referral to EP for RFA-pulmonary vein isolation, as this could have a very positive impact, maybe even curative effect for his atrial fibrillation. Obviously, diagnosis and treatment for his intracranial mass takes priority. Pulmonary vein ablation will require full anticoagulation.   Sanda Klein, MD, [facility] [phone] office [phone]  pager 08/20/2015 8:52 AM

## 2015-08-20 NOTE — Progress Notes (Signed)
Pt refuse NIV for the night. Pt is stable at this time no distress noted.

## 2015-11-23 ENCOUNTER — Other Ambulatory Visit: Payer: Self-pay | Admitting: Neurosurgery

## 2015-11-23 DIAGNOSIS — D496 Neoplasm of unspecified behavior of brain: Secondary | ICD-10-CM

## 2015-11-24 ENCOUNTER — Ambulatory Visit
Admission: RE | Admit: 2015-11-24 | Discharge: 2015-11-24 | Disposition: A | Discharge status: Home / Self Care | Payer: BLUE CROSS/BLUE SHIELD | Source: Ambulatory Visit | Attending: Neurosurgery | Admitting: Neurosurgery

## 2015-11-24 ENCOUNTER — Other Ambulatory Visit
Admission: RE | Admit: 2015-11-24 | Discharge: 2015-11-24 | Disposition: A | Discharge status: Home / Self Care | Payer: BLUE CROSS/BLUE SHIELD | Source: Ambulatory Visit | Attending: Neurosurgery | Admitting: Neurosurgery

## 2015-11-24 DIAGNOSIS — D496 Neoplasm of unspecified behavior of brain: Secondary | ICD-10-CM | POA: Insufficient documentation

## 2015-11-24 DIAGNOSIS — G935 Compression of brain: Secondary | ICD-10-CM | POA: Diagnosis not present

## 2015-11-24 DIAGNOSIS — J320 Chronic maxillary sinusitis: Secondary | ICD-10-CM | POA: Insufficient documentation

## 2015-11-24 LAB — CREATININE, SERUM: Creatinine, Ser: 1.21 mg/dL (ref 0.61–1.24)

## 2015-11-24 LAB — BUN: BUN: 22 mg/dL — ABNORMAL HIGH (ref 6–20)

## 2015-11-24 MED ORDER — GADOBENATE DIMEGLUMINE 529 MG/ML IV SOLN
20.0000 mL | Freq: Once | INTRAVENOUS | Status: AC | PRN
  Administered 2015-11-24: 20 mL via INTRAVENOUS

## 2015-12-01 ENCOUNTER — Other Ambulatory Visit: Payer: Self-pay | Admitting: Neurosurgery

## 2015-12-03 ENCOUNTER — Other Ambulatory Visit (HOSPITAL_COMMUNITY): Payer: Self-pay | Admitting: Neurosurgery

## 2015-12-03 DIAGNOSIS — D496 Neoplasm of unspecified behavior of brain: Secondary | ICD-10-CM

## 2015-12-08 NOTE — Pre-Procedure Instructions (Signed)
Duy [facility]  12/08/2015      [id number] [place] - [address] [place]  [id number]: [phone]: [phone]   Your procedure is scheduled on Wednesday August 16  [place]  Call this number if you have problems the morning of surgery:  [phone]   Remember:  Do not eat food or drink liquids after midnight.   Take these medicines the morning of surgery with A SIP OF WATER diltiazem (cardizem), fluticasone (flonase), hydrocodone-acetaminophen (norco) if needed, loratadine (clarintin), metoprolol,   7 days prior to surgery STOP taking any Aspirin, MOBIC, Aleve, Naproxen, Ibuprofen, Motrin, Advil, Goody's, BC's, all herbal medications, fish oil, and all vitamins    Do not wear jewelry.  Do not wear lotions, powders, or perfumes.  You may NOT wear deoderant.  Men may shave face and neck.  Do not bring valuables to the hospital.  [facility] for any belongings or [facility]ables.  Contacts, dentures or bridgework may not be worn into surgery.  Leave your suitcase in the car.  After surgery it may be brought to your room.  For patients admitted to the hospital, discharge time will be determined by your treatment team.  Patients discharged the day of surgery will not be allowed to drive home.    Special instructions:   [facility]- Preparing For Surgery  Before surgery, you can play an important role. Because skin is not sterile, your skin needs to be as free of germs as possible. You can reduce the number of germs on your skin by washing with CHG (chlorahexidine gluconate) Soap before surgery.  CHG is an antiseptic cleaner which kills germs and bonds with the skin to continue killing germs even after washing.  Please do not use if you have an allergy to CHG or antibacterial soaps. If your skin becomes reddened/irritated stop using the CHG.  Do not shave (including  legs and underarms) for at least 48 hours prior to first CHG shower. It is OK to shave your face.  Please follow these instructions carefully.   1. Shower the NIGHT BEFORE SURGERY and the MORNING OF SURGERY with CHG.   2. If you chose to wash your hair, wash your hair first as usual with your normal shampoo.  3. After you shampoo, rinse your hair and body thoroughly to remove the shampoo.  4. Use CHG as you would any other liquid soap. You can apply CHG directly to the skin and wash gently with a scrungie or a clean washcloth.   5. Apply the CHG Soap to your body ONLY FROM THE NECK DOWN.  Do not use on open wounds or open sores. Avoid contact with your eyes, ears, mouth and genitals (private parts). Wash genitals (private parts) with your normal soap.  6. Wash thoroughly, paying special attention to the area where your surgery will be performed.  7. Thoroughly rinse your body with warm water from the neck down.  8. DO NOT shower/wash with your normal soap after using and rinsing off the CHG Soap.  9. Pat yourself dry with a CLEAN TOWEL.   10. Wear CLEAN PAJAMAS   11. Place CLEAN SHEETS on your bed the night of your first shower and DO NOT SLEEP WITH PETS.    Day of Surgery: Do not apply any deodorants/lotions. Please wear clean clothes to the hospital/surgery center.  Please read over the following fact sheets that you were given. Pain Booklet, Coughing and Deep Breathing and Surgical Site Infection Prevention

## 2015-12-09 ENCOUNTER — Encounter (HOSPITAL_COMMUNITY)
Admission: RE | Admit: 2015-12-09 | Discharge: 2015-12-09 | Disposition: A | Discharge status: Home / Self Care | Payer: BLUE CROSS/BLUE SHIELD | Source: Ambulatory Visit | Attending: Neurosurgery | Admitting: Neurosurgery

## 2015-12-09 ENCOUNTER — Ambulatory Visit (HOSPITAL_COMMUNITY)
Admission: RE | Admit: 2015-12-09 | Discharge: 2015-12-09 | Disposition: A | Discharge status: Home / Self Care | Payer: BLUE CROSS/BLUE SHIELD | Source: Ambulatory Visit | Attending: Neurosurgery | Admitting: Neurosurgery

## 2015-12-09 ENCOUNTER — Encounter (HOSPITAL_COMMUNITY): Payer: Self-pay

## 2015-12-09 DIAGNOSIS — D496 Neoplasm of unspecified behavior of brain: Secondary | ICD-10-CM | POA: Diagnosis present

## 2015-12-09 HISTORY — DX: Calculus of kidney: N20.0

## 2015-12-09 HISTORY — DX: Neoplasm of unspecified behavior of brain: D49.6

## 2015-12-09 HISTORY — DX: Prediabetes: R73.03

## 2015-12-09 LAB — COMPREHENSIVE METABOLIC PANEL
ALK PHOS: 53 U/L (ref 38–126)
ALT: 68 U/L — ABNORMAL HIGH (ref 17–63)
ANION GAP: 10 (ref 5–15)
AST: 24 U/L (ref 15–41)
Albumin: 3.6 g/dL (ref 3.5–5.0)
BUN: 24 mg/dL — ABNORMAL HIGH (ref 6–20)
CALCIUM: 9.4 mg/dL (ref 8.9–10.3)
CO2: 24 mmol/L (ref 22–32)
Chloride: 100 mmol/L — ABNORMAL LOW (ref 101–111)
Creatinine, Ser: 0.81 mg/dL (ref 0.61–1.24)
Glucose, Bld: 140 mg/dL — ABNORMAL HIGH (ref 65–99)
Potassium: 4.3 mmol/L (ref 3.5–5.1)
SODIUM: 134 mmol/L — AB (ref 135–145)
Total Bilirubin: 0.8 mg/dL (ref 0.3–1.2)
Total Protein: 7.1 g/dL (ref 6.5–8.1)

## 2015-12-09 LAB — CBC
HCT: 47.9 % (ref 39.0–52.0)
Hemoglobin: 16.7 g/dL (ref 13.0–17.0)
MCH: 31.9 pg (ref 26.0–34.0)
MCHC: 34.9 g/dL (ref 30.0–36.0)
MCV: 91.6 fL (ref 78.0–100.0)
Platelets: 272 10*3/uL (ref 150–400)
RBC: 5.23 MIL/uL (ref 4.22–5.81)
RDW: 12.9 % (ref 11.5–15.5)
WBC: 18.9 10*3/uL — ABNORMAL HIGH (ref 4.0–10.5)

## 2015-12-09 LAB — GLUCOSE, CAPILLARY: Glucose-Capillary: 137 mg/dL — ABNORMAL HIGH (ref 65–99)

## 2015-12-09 MED ORDER — GADOBENATE DIMEGLUMINE 529 MG/ML IV SOLN
20.0000 mL | Freq: Once | INTRAVENOUS | Status: AC | PRN
  Administered 2015-12-09: 20 mL via INTRAVENOUS

## 2015-12-09 NOTE — Progress Notes (Signed)
PCP - Charanjit Virk Cardiologist - Dr Bartholome Bill - [facility]  Chest x-ray - not needed EKG - 08/17/15 Stress Test - requested from dr Ubaldo Glassing ECHO - 08/19/15  Cardiac Cath - denies  Patient is pre-diabetic and is controlled by his diet, he does not check is blood sugars regularly  Had a sleep study in 2000 cant remember the physicians name to get records. Patient wears CPAP with settings of 5   Will send to anesthesia for review and needs cardiac clearance    Patient denies shortness of breath, fever, cough and chest pain at PAT appointment

## 2015-12-10 ENCOUNTER — Encounter (HOSPITAL_COMMUNITY): Payer: Self-pay

## 2015-12-10 NOTE — Progress Notes (Signed)
Anesthesia Chart Review:  Pt is a 65 year old male scheduled for L frontal craniotomy on 12/16/2015 with Newman Pies, MD.   Cardiologist is Bartholome Bill, MD. PCP is Sherrin Daisy, MD. Both have notes in care everywhere.   BP 119/66   Pulse (!) 44   Resp 20   Ht '5\' 11"'$  (1.803 m)   SpO2 99%   PMH includes:  SVT, pre-diabetes, OSA, GERD. Former smoker. BMI 33  Pt underwent brain biopsy 08/17/15. Developed post-op atrial fibrillation. This resolved spontaneously and converted to SR; then pt had with very frequent brief bursts of atrial tachycardia, strongly suggestive of pulmonary vein tachycardia. Dr. Sallyanne Kuster with cardiology notes, "obviously, diagnosis and treatment for his intracranial mass takes priority. Pulmonary vein ablation will require full anticoagulation".  Medications include: dexamethasone, diltiazem, metoprolol, crestor.   Preoperative labs reviewed.  WBC 18.9. Pt is taking dexamethasone. Left message for [facility] in Dr. Arnoldo Morale' office.   EKG 08/17/15: Atrial flutter with 2:1 A-V conduction, new since previous tracing. Nonspecific ST and T wave abnormality.   Echo 08/19/15:  - Left ventricle: The cavity size was normal. Systolic function was normal. The estimated ejection fraction was in the range of 50% to 55%. Images were inadequate for LV wall motion assessment. - Recommendations: Recommend limited study with definity contrast for better assessment of LVF and wall motion.  If no changes, I anticipate pt can proceed with surgery as scheduled.   Willeen Cass, FNP-BC [facility]/Anesthesiology Phone: [phone] 12/10/2015 4:29 PM

## 2015-12-15 MED ORDER — CEFAZOLIN SODIUM-DEXTROSE 2-4 GM/100ML-% IV SOLN
2.0000 g | INTRAVENOUS | Status: AC
  Administered 2015-12-16: 2 g via INTRAVENOUS
  Filled 2015-12-15: qty 100

## 2015-12-16 ENCOUNTER — Inpatient Hospital Stay (HOSPITAL_COMMUNITY): Payer: BLUE CROSS/BLUE SHIELD | Admitting: Certified Registered Nurse Anesthetist

## 2015-12-16 ENCOUNTER — Inpatient Hospital Stay (HOSPITAL_COMMUNITY)
Admission: RE | Admit: 2015-12-16 | Discharge: 2015-12-19 | DRG: 025 | Disposition: A | Discharge status: Home / Self Care | Payer: BLUE CROSS/BLUE SHIELD | Source: Ambulatory Visit | Attending: Neurosurgery | Admitting: Neurosurgery

## 2015-12-16 ENCOUNTER — Encounter (HOSPITAL_COMMUNITY): Payer: Self-pay | Admitting: *Deleted

## 2015-12-16 ENCOUNTER — Inpatient Hospital Stay (HOSPITAL_COMMUNITY): Payer: BLUE CROSS/BLUE SHIELD | Admitting: Emergency Medicine

## 2015-12-16 ENCOUNTER — Encounter (HOSPITAL_COMMUNITY)
Admission: RE | Disposition: A | Discharge status: Home / Self Care | Payer: Self-pay | Source: Ambulatory Visit | Attending: Neurosurgery

## 2015-12-16 ENCOUNTER — Inpatient Hospital Stay (HOSPITAL_COMMUNITY): Payer: BLUE CROSS/BLUE SHIELD

## 2015-12-16 DIAGNOSIS — C719 Malignant neoplasm of brain, unspecified: Principal | ICD-10-CM | POA: Diagnosis present

## 2015-12-16 DIAGNOSIS — Z87891 Personal history of nicotine dependence: Secondary | ICD-10-CM | POA: Diagnosis not present

## 2015-12-16 DIAGNOSIS — Z791 Long term (current) use of non-steroidal anti-inflammatories (NSAID): Secondary | ICD-10-CM | POA: Diagnosis not present

## 2015-12-16 DIAGNOSIS — Z7951 Long term (current) use of inhaled steroids: Secondary | ICD-10-CM

## 2015-12-16 DIAGNOSIS — D332 Benign neoplasm of brain, unspecified: Secondary | ICD-10-CM

## 2015-12-16 DIAGNOSIS — I10 Essential (primary) hypertension: Secondary | ICD-10-CM | POA: Diagnosis present

## 2015-12-16 DIAGNOSIS — K219 Gastro-esophageal reflux disease without esophagitis: Secondary | ICD-10-CM | POA: Diagnosis present

## 2015-12-16 DIAGNOSIS — I4892 Unspecified atrial flutter: Secondary | ICD-10-CM | POA: Diagnosis present

## 2015-12-16 DIAGNOSIS — I629 Nontraumatic intracranial hemorrhage, unspecified: Secondary | ICD-10-CM | POA: Diagnosis not present

## 2015-12-16 DIAGNOSIS — R4182 Altered mental status, unspecified: Secondary | ICD-10-CM | POA: Diagnosis present

## 2015-12-16 DIAGNOSIS — G4730 Sleep apnea, unspecified: Secondary | ICD-10-CM | POA: Diagnosis present

## 2015-12-16 DIAGNOSIS — C3490 Malignant neoplasm of unspecified part of unspecified bronchus or lung: Secondary | ICD-10-CM | POA: Diagnosis not present

## 2015-12-16 DIAGNOSIS — R4701 Aphasia: Secondary | ICD-10-CM | POA: Diagnosis present

## 2015-12-16 DIAGNOSIS — R4702 Dysphasia: Secondary | ICD-10-CM | POA: Diagnosis present

## 2015-12-16 DIAGNOSIS — Z808 Family history of malignant neoplasm of other organs or systems: Secondary | ICD-10-CM | POA: Diagnosis not present

## 2015-12-16 DIAGNOSIS — C7931 Secondary malignant neoplasm of brain: Secondary | ICD-10-CM | POA: Diagnosis not present

## 2015-12-16 DIAGNOSIS — Z23 Encounter for immunization: Secondary | ICD-10-CM | POA: Diagnosis not present

## 2015-12-16 DIAGNOSIS — E119 Type 2 diabetes mellitus without complications: Secondary | ICD-10-CM | POA: Diagnosis present

## 2015-12-16 DIAGNOSIS — G936 Cerebral edema: Secondary | ICD-10-CM | POA: Diagnosis not present

## 2015-12-16 DIAGNOSIS — E871 Hypo-osmolality and hyponatremia: Secondary | ICD-10-CM | POA: Diagnosis present

## 2015-12-16 DIAGNOSIS — I4891 Unspecified atrial fibrillation: Secondary | ICD-10-CM | POA: Diagnosis not present

## 2015-12-16 DIAGNOSIS — I610 Nontraumatic intracerebral hemorrhage in hemisphere, subcortical: Secondary | ICD-10-CM | POA: Diagnosis not present

## 2015-12-16 DIAGNOSIS — D496 Neoplasm of unspecified behavior of brain: Secondary | ICD-10-CM | POA: Diagnosis not present

## 2015-12-16 HISTORY — PX: APPLICATION OF CRANIAL NAVIGATION: SHX6578

## 2015-12-16 HISTORY — PX: CRANIOTOMY: SHX93

## 2015-12-16 LAB — TYPE AND SCREEN
ABO/RH(D): A POS
ANTIBODY SCREEN: NEGATIVE

## 2015-12-16 LAB — GLUCOSE, CAPILLARY
Glucose-Capillary: 163 mg/dL — ABNORMAL HIGH (ref 65–99)
Glucose-Capillary: 163 mg/dL — ABNORMAL HIGH (ref 65–99)

## 2015-12-16 SURGERY — CRANIOTOMY TUMOR EXCISION
Anesthesia: General

## 2015-12-16 MED ORDER — PROMETHAZINE HCL 25 MG PO TABS: 12.5000 mg | ORAL_TABLET | ORAL | Status: DC | PRN

## 2015-12-16 MED ORDER — PHENYLEPHRINE 40 MCG/ML (10ML) SYRINGE FOR IV PUSH (FOR BLOOD PRESSURE SUPPORT)
PREFILLED_SYRINGE | INTRAVENOUS | Status: AC
Start: 2015-12-16 — End: 2015-12-16
  Filled 2015-12-16: qty 10

## 2015-12-16 MED ORDER — SODIUM CHLORIDE 0.9 % IV SOLN
1000.0000 mg | Freq: Once | INTRAVENOUS | Status: AC
  Administered 2015-12-16: 1000 mg via INTRAVENOUS
  Filled 2015-12-16: qty 10

## 2015-12-16 MED ORDER — HYDROMORPHONE HCL 1 MG/ML IJ SOLN: 0.2500 mg | INTRAMUSCULAR | Status: DC | PRN

## 2015-12-16 MED ORDER — METOPROLOL SUCCINATE ER 50 MG PO TB24
100.0000 mg | ORAL_TABLET | Freq: Every day | ORAL | Status: DC
  Administered 2015-12-19: 100 mg via ORAL
  Filled 2015-12-16 (×2): qty 2

## 2015-12-16 MED ORDER — PROPOFOL 10 MG/ML IV BOLUS
INTRAVENOUS | Status: DC | PRN
  Administered 2015-12-16: 200 mg via INTRAVENOUS

## 2015-12-16 MED ORDER — FENTANYL CITRATE (PF) 100 MCG/2ML IJ SOLN
INTRAMUSCULAR | Status: AC
  Filled 2015-12-16: qty 4

## 2015-12-16 MED ORDER — CHLORHEXIDINE GLUCONATE CLOTH 2 % EX PADS: 6.0000 | MEDICATED_PAD | Freq: Once | CUTANEOUS | Status: DC

## 2015-12-16 MED ORDER — FLUTICASONE PROPIONATE 50 MCG/ACT NA SUSP: 2.0000 | Freq: Every day | NASAL | Status: DC | PRN

## 2015-12-16 MED ORDER — PROPOFOL 10 MG/ML IV BOLUS
INTRAVENOUS | Status: AC
  Filled 2015-12-16: qty 20

## 2015-12-16 MED ORDER — ROSUVASTATIN CALCIUM 10 MG PO TABS
10.0000 mg | ORAL_TABLET | Freq: Every day | ORAL | Status: DC
  Administered 2015-12-18: 10 mg via ORAL
  Filled 2015-12-16 (×4): qty 1

## 2015-12-16 MED ORDER — POTASSIUM CHLORIDE IN NACL 20-0.9 MEQ/L-% IV SOLN
INTRAVENOUS | Status: DC
  Administered 2015-12-16: 20:00:00 via INTRAVENOUS
  Filled 2015-12-16 (×2): qty 1000

## 2015-12-16 MED ORDER — ROCURONIUM BROMIDE 100 MG/10ML IV SOLN
INTRAVENOUS | Status: DC | PRN
  Administered 2015-12-16 (×3): 20 mg via INTRAVENOUS
  Administered 2015-12-16 (×2): 40 mg via INTRAVENOUS
  Administered 2015-12-16: 10 mg via INTRAVENOUS

## 2015-12-16 MED ORDER — FENTANYL CITRATE (PF) 100 MCG/2ML IJ SOLN
INTRAMUSCULAR | Status: AC
  Filled 2015-12-16: qty 2

## 2015-12-16 MED ORDER — PROMETHAZINE HCL 25 MG/ML IJ SOLN: 6.2500 mg | INTRAMUSCULAR | Status: DC | PRN

## 2015-12-16 MED ORDER — DOCUSATE SODIUM 100 MG PO CAPS
100.0000 mg | ORAL_CAPSULE | Freq: Two times a day (BID) | ORAL | Status: DC
  Administered 2015-12-18 – 2015-12-19 (×3): 100 mg via ORAL
  Filled 2015-12-16 (×3): qty 1

## 2015-12-16 MED ORDER — LACTATED RINGERS IV SOLN
INTRAVENOUS | Status: DC
  Administered 2015-12-16: 19:00:00 via INTRAVENOUS
  Administered 2015-12-16: 50 mL/h via INTRAVENOUS

## 2015-12-16 MED ORDER — ROCURONIUM BROMIDE 10 MG/ML (PF) SYRINGE
PREFILLED_SYRINGE | INTRAVENOUS | Status: AC
Start: 2015-12-16 — End: 2015-12-16
  Filled 2015-12-16: qty 10

## 2015-12-16 MED ORDER — ACETAMINOPHEN 325 MG PO TABS: 650.0000 mg | ORAL_TABLET | ORAL | Status: DC | PRN

## 2015-12-16 MED ORDER — LORATADINE 10 MG PO TABS: 10.0000 mg | ORAL_TABLET | Freq: Every day | ORAL | Status: DC | PRN

## 2015-12-16 MED ORDER — ACETAMINOPHEN 650 MG RE SUPP: 650.0000 mg | RECTAL | Status: DC | PRN

## 2015-12-16 MED ORDER — MIDAZOLAM HCL 2 MG/2ML IJ SOLN
INTRAMUSCULAR | Status: DC | PRN
  Administered 2015-12-16: 2 mg via INTRAVENOUS

## 2015-12-16 MED ORDER — SUCCINYLCHOLINE CHLORIDE 200 MG/10ML IV SOSY
PREFILLED_SYRINGE | INTRAVENOUS | Status: AC
  Filled 2015-12-16: qty 10

## 2015-12-16 MED ORDER — NORTRIPTYLINE HCL 10 MG PO CAPS
10.0000 mg | ORAL_CAPSULE | Freq: Every day | ORAL | Status: DC
  Administered 2015-12-18: 10 mg via ORAL
  Filled 2015-12-16 (×3): qty 1

## 2015-12-16 MED ORDER — MIDAZOLAM HCL 2 MG/2ML IJ SOLN
INTRAMUSCULAR | Status: AC
Start: 2015-12-16 — End: 2015-12-16
  Filled 2015-12-16: qty 2

## 2015-12-16 MED ORDER — THROMBIN 20000 UNITS EX SOLR
CUTANEOUS | Status: DC | PRN
  Administered 2015-12-16: 10:00:00 via TOPICAL

## 2015-12-16 MED ORDER — DEXAMETHASONE SODIUM PHOSPHATE 10 MG/ML IJ SOLN: 10.0000 mg | Freq: Once | INTRAMUSCULAR | Status: DC

## 2015-12-16 MED ORDER — HYDROCODONE-ACETAMINOPHEN 5-325 MG PO TABS: 1.0000 | ORAL_TABLET | ORAL | Status: DC | PRN

## 2015-12-16 MED ORDER — LEVETIRACETAM 500 MG/5ML IV SOLN
500.0000 mg | INTRAVENOUS | Status: AC
  Administered 2015-12-16: 500 mg via INTRAVENOUS
  Filled 2015-12-16: qty 5

## 2015-12-16 MED ORDER — LIDOCAINE 2% (20 MG/ML) 5 ML SYRINGE
INTRAMUSCULAR | Status: DC | PRN
  Administered 2015-12-16: 100 mg via INTRAVENOUS

## 2015-12-16 MED ORDER — EPHEDRINE SULFATE 50 MG/ML IJ SOLN
INTRAMUSCULAR | Status: AC
  Filled 2015-12-16: qty 1

## 2015-12-16 MED ORDER — LABETALOL HCL 5 MG/ML IV SOLN
INTRAVENOUS | Status: DC | PRN
  Administered 2015-12-16: 2.5 mg via INTRAVENOUS
  Administered 2015-12-16: 5 mg via INTRAVENOUS
  Administered 2015-12-16: 2.5 mg via INTRAVENOUS

## 2015-12-16 MED ORDER — DILTIAZEM HCL ER COATED BEADS 180 MG PO CP24: 360.0000 mg | ORAL_CAPSULE | Freq: Every day | ORAL | Status: DC

## 2015-12-16 MED ORDER — SODIUM CHLORIDE 0.9 % IR SOLN
Status: DC | PRN
  Administered 2015-12-16: 10:00:00

## 2015-12-16 MED ORDER — PHENYLEPHRINE HCL 10 MG/ML IJ SOLN
INTRAVENOUS | Status: DC | PRN
  Administered 2015-12-16: 30 ug/min via INTRAVENOUS

## 2015-12-16 MED ORDER — BACITRACIN ZINC 500 UNIT/GM EX OINT
TOPICAL_OINTMENT | CUTANEOUS | Status: DC | PRN
  Administered 2015-12-16: 1 via TOPICAL

## 2015-12-16 MED ORDER — DEXAMETHASONE SODIUM PHOSPHATE 10 MG/ML IJ SOLN
10.0000 mg | Freq: Four times a day (QID) | INTRAMUSCULAR | Status: AC
  Administered 2015-12-16 – 2015-12-17 (×4): 10 mg via INTRAVENOUS
  Filled 2015-12-16 (×4): qty 1

## 2015-12-16 MED ORDER — LABETALOL HCL 5 MG/ML IV SOLN
INTRAVENOUS | Status: AC
  Filled 2015-12-16: qty 4

## 2015-12-16 MED ORDER — ONDANSETRON HCL 4 MG/2ML IJ SOLN
INTRAMUSCULAR | Status: DC | PRN
  Administered 2015-12-16: 4 mg via INTRAVENOUS

## 2015-12-16 MED ORDER — LABETALOL HCL 5 MG/ML IV SOLN: 10.0000 mg | INTRAVENOUS | Status: DC | PRN

## 2015-12-16 MED ORDER — CEFAZOLIN SODIUM-DEXTROSE 2-4 GM/100ML-% IV SOLN
2.0000 g | Freq: Three times a day (TID) | INTRAVENOUS | Status: AC
  Administered 2015-12-16 – 2015-12-17 (×2): 2 g via INTRAVENOUS
  Filled 2015-12-16 (×2): qty 100

## 2015-12-16 MED ORDER — MEPERIDINE HCL 25 MG/ML IJ SOLN: 6.2500 mg | INTRAMUSCULAR | Status: DC | PRN

## 2015-12-16 MED ORDER — SODIUM CHLORIDE 0.9 % IV SOLN
INTRAVENOUS | Status: DC | PRN
  Administered 2015-12-16: 09:00:00 via INTRAVENOUS

## 2015-12-16 MED ORDER — MANNITOL 25 % IV SOLN
INTRAVENOUS | Status: DC | PRN
  Administered 2015-12-16: 12.5 g via INTRAVENOUS

## 2015-12-16 MED ORDER — MORPHINE SULFATE (PF) 2 MG/ML IV SOLN
1.0000 mg | INTRAVENOUS | Status: DC | PRN
  Administered 2015-12-16: 1 mg via INTRAVENOUS
  Administered 2015-12-17 – 2015-12-18 (×4): 2 mg via INTRAVENOUS
  Filled 2015-12-16 (×6): qty 1

## 2015-12-16 MED ORDER — FAMOTIDINE IN NACL 20-0.9 MG/50ML-% IV SOLN
20.0000 mg | Freq: Two times a day (BID) | INTRAVENOUS | Status: DC
Start: 2015-12-16 — End: 2015-12-18
  Administered 2015-12-16 – 2015-12-17 (×3): 20 mg via INTRAVENOUS
  Filled 2015-12-16 (×3): qty 50

## 2015-12-16 MED ORDER — SUGAMMADEX SODIUM 200 MG/2ML IV SOLN
INTRAVENOUS | Status: DC | PRN
  Administered 2015-12-16: 220 mg via INTRAVENOUS

## 2015-12-16 MED ORDER — SODIUM CHLORIDE 0.9 % IV SOLN
500.0000 mg | Freq: Two times a day (BID) | INTRAVENOUS | Status: DC
  Administered 2015-12-16: 500 mg via INTRAVENOUS
  Filled 2015-12-16 (×2): qty 5

## 2015-12-16 MED ORDER — BUPIVACAINE-EPINEPHRINE 0.5% -1:200000 IJ SOLN
INTRAMUSCULAR | Status: DC | PRN
  Administered 2015-12-16: 10 mL

## 2015-12-16 MED ORDER — HEMOSTATIC AGENTS (NO CHARGE) OPTIME
TOPICAL | Status: DC | PRN
  Administered 2015-12-16: 1 via TOPICAL

## 2015-12-16 MED ORDER — FENTANYL CITRATE (PF) 100 MCG/2ML IJ SOLN
INTRAMUSCULAR | Status: DC | PRN
  Administered 2015-12-16: 50 ug via INTRAVENOUS
  Administered 2015-12-16: 100 ug via INTRAVENOUS
  Administered 2015-12-16: 50 ug via INTRAVENOUS
  Administered 2015-12-16 (×2): 100 ug via INTRAVENOUS

## 2015-12-16 MED ORDER — LIDOCAINE 2% (20 MG/ML) 5 ML SYRINGE
INTRAMUSCULAR | Status: AC
  Filled 2015-12-16: qty 5

## 2015-12-16 MED ORDER — THROMBIN 5000 UNITS EX SOLR
OROMUCOSAL | Status: DC | PRN
  Administered 2015-12-16: 12:00:00 via TOPICAL

## 2015-12-16 MED ORDER — ONDANSETRON HCL 4 MG/2ML IJ SOLN
INTRAMUSCULAR | Status: AC
Start: 2015-12-16 — End: 2015-12-16
  Filled 2015-12-16: qty 2

## 2015-12-16 MED ORDER — DEXAMETHASONE SODIUM PHOSPHATE 4 MG/ML IJ SOLN
8.0000 mg | Freq: Four times a day (QID) | INTRAMUSCULAR | Status: AC
  Administered 2015-12-17 – 2015-12-18 (×4): 8 mg via INTRAVENOUS
  Filled 2015-12-16 (×4): qty 2

## 2015-12-16 MED ORDER — DEXAMETHASONE SODIUM PHOSPHATE 10 MG/ML IJ SOLN
INTRAMUSCULAR | Status: DC | PRN
  Administered 2015-12-16: 20 mg via INTRAVENOUS

## 2015-12-16 MED ORDER — DEXAMETHASONE SODIUM PHOSPHATE 10 MG/ML IJ SOLN
INTRAMUSCULAR | Status: AC
  Filled 2015-12-16: qty 2

## 2015-12-16 MED ORDER — ONDANSETRON HCL 4 MG PO TABS
4.0000 mg | ORAL_TABLET | ORAL | Status: DC | PRN
Start: 2015-12-16 — End: 2015-12-19

## 2015-12-16 MED ORDER — SODIUM CHLORIDE 0.9 % IV SOLN
INTRAVENOUS | Status: DC | PRN
  Administered 2015-12-16 (×2): via INTRAVENOUS

## 2015-12-16 MED ORDER — ONDANSETRON HCL 4 MG/2ML IJ SOLN: 4.0000 mg | INTRAMUSCULAR | Status: DC | PRN

## 2015-12-16 MED ORDER — DEXAMETHASONE SODIUM PHOSPHATE 4 MG/ML IJ SOLN
4.0000 mg | Freq: Three times a day (TID) | INTRAMUSCULAR | Status: DC
  Administered 2015-12-18 – 2015-12-19 (×2): 4 mg via INTRAVENOUS
  Filled 2015-12-16 (×3): qty 1

## 2015-12-16 SURGICAL SUPPLY — 89 items
BAG DECANTER FOR FLEXI CONT (MISCELLANEOUS) ×4 IMPLANT
BATTERY IQ STERILE (MISCELLANEOUS) ×4 IMPLANT
BIT DRILL WIRE PASS 1.3MM (BIT) IMPLANT
BLADE CLIPPER SPEC (BLADE) IMPLANT
BLADE ULTRA TIP 2M (BLADE) IMPLANT
BRUSH SCRUB EZ PLAIN DRY (MISCELLANEOUS) ×4 IMPLANT
BUR ACORN 6.0 PRECISION (BURR) ×3 IMPLANT
BUR ACORN 6.0MM PRECISION (BURR) ×1
BUR SPIRAL ROUTER 2.3 (BUR) IMPLANT
BUR SPIRAL ROUTER 2.3MM (BUR)
CANISTER SUCT 3000ML PPV (MISCELLANEOUS) ×8 IMPLANT
CATH VENTRIC 35X38 W/TROCAR LG (CATHETERS) IMPLANT
CLIP TI MEDIUM 6 (CLIP) IMPLANT
CONT SPEC 4OZ CLIKSEAL STRL BL (MISCELLANEOUS) ×4 IMPLANT
COVER BACK TABLE 60X90IN (DRAPES) IMPLANT
DRAIN JACKSON PRATT 10MM FLAT (MISCELLANEOUS) ×4 IMPLANT
DRAIN SNY WOU 7FLT (WOUND CARE) IMPLANT
DRAPE MICROSCOPE LEICA (MISCELLANEOUS) ×4 IMPLANT
DRAPE NEUROLOGICAL W/INCISE (DRAPES) ×4 IMPLANT
DRAPE SURG 17X23 STRL (DRAPES) IMPLANT
DRAPE WARM FLUID 44X44 (DRAPE) ×4 IMPLANT
DRILL WIRE PASS 1.3MM (BIT)
ELECT CAUTERY BLADE 6.4 (BLADE) ×4 IMPLANT
ELECT REM PT RETURN 9FT ADLT (ELECTROSURGICAL) ×4
ELECTRODE REM PT RTRN 9FT ADLT (ELECTROSURGICAL) ×2 IMPLANT
EVACUATOR 1/8 PVC DRAIN (DRAIN) IMPLANT
EVACUATOR SILICONE 100CC (DRAIN) ×4 IMPLANT
FORCEPS BIPOLAR SPETZLER 8 1.0 (NEUROSURGERY SUPPLIES) ×4 IMPLANT
GAUZE SPONGE 4X4 12PLY STRL (GAUZE/BANDAGES/DRESSINGS) ×4 IMPLANT
GAUZE SPONGE 4X4 16PLY XRAY LF (GAUZE/BANDAGES/DRESSINGS) IMPLANT
GLOVE BIO SURGEON STRL SZ8 (GLOVE) ×4 IMPLANT
GLOVE BIO SURGEON STRL SZ8.5 (GLOVE) ×4 IMPLANT
GLOVE ECLIPSE 7.0 STRL STRAW (GLOVE) ×4 IMPLANT
GLOVE EXAM NITRILE LRG STRL (GLOVE) IMPLANT
GLOVE EXAM NITRILE MD LF STRL (GLOVE) IMPLANT
GLOVE EXAM NITRILE XL STR (GLOVE) IMPLANT
GLOVE EXAM NITRILE XS STR PU (GLOVE) IMPLANT
GLOVE INDICATOR 7.0 STRL GRN (GLOVE) ×8 IMPLANT
GLOVE INDICATOR 7.5 STRL GRN (GLOVE) ×8 IMPLANT
GLOVE SURG SS PI 6.5 STRL IVOR (GLOVE) ×4 IMPLANT
GOWN STRL REUS W/ TWL LRG LVL3 (GOWN DISPOSABLE) ×4 IMPLANT
GOWN STRL REUS W/ TWL XL LVL3 (GOWN DISPOSABLE) ×2 IMPLANT
GOWN STRL REUS W/TWL LRG LVL3 (GOWN DISPOSABLE) ×4
GOWN STRL REUS W/TWL XL LVL3 (GOWN DISPOSABLE) ×2
HEMOSTAT POWDER SURGIFOAM 1G (HEMOSTASIS) ×4 IMPLANT
HEMOSTAT SURGICEL 2X14 (HEMOSTASIS) ×4 IMPLANT
KIT BASIN OR (CUSTOM PROCEDURE TRAY) ×4 IMPLANT
KIT CLIP RANEY GUN (KITS) ×8 IMPLANT
KIT DRAIN CSF ACCUDRAIN (MISCELLANEOUS) IMPLANT
KIT ROOM TURNOVER OR (KITS) ×4 IMPLANT
MARKER SKIN DUAL TIP RULER LAB (MISCELLANEOUS) IMPLANT
MARKER SPHERE PSV REFLC 13MM (MARKER) ×8 IMPLANT
NEEDLE HYPO 22GX1.5 SAFETY (NEEDLE) ×4 IMPLANT
NS IRRIG 1000ML POUR BTL (IV SOLUTION) ×12 IMPLANT
PACK CRANIOTOMY (CUSTOM PROCEDURE TRAY) ×4 IMPLANT
PAD ARMBOARD 7.5X6 YLW CONV (MISCELLANEOUS) ×4 IMPLANT
PATTIES SURGICAL .25X.25 (GAUZE/BANDAGES/DRESSINGS) IMPLANT
PATTIES SURGICAL .5 X.5 (GAUZE/BANDAGES/DRESSINGS) IMPLANT
PATTIES SURGICAL .5 X3 (DISPOSABLE) ×4 IMPLANT
PATTIES SURGICAL 1X1 (DISPOSABLE) IMPLANT
PIN MAYFIELD SKULL DISP (PIN) ×4 IMPLANT
PLATE 1.5  2HOLE LNG NEURO (Plate) ×4 IMPLANT
PLATE 1.5 2HOLE LNG NEURO (Plate) ×4 IMPLANT
PLATE 1.5/0.5 18.5MM BURR HOLE (Plate) ×4 IMPLANT
RUBBERBAND STERILE (MISCELLANEOUS) ×8 IMPLANT
SCREW SELF DRILL HT 1.5/4MM (Screw) ×36 IMPLANT
SPECIMEN JAR SMALL (MISCELLANEOUS) IMPLANT
SPONGE NEURO XRAY DETECT 1X3 (DISPOSABLE) IMPLANT
SPONGE SURGIFOAM ABS GEL 100 (HEMOSTASIS) ×4 IMPLANT
STAPLER SKIN PROX WIDE 3.9 (STAPLE) ×4 IMPLANT
STOCKINETTE 6  STRL (DRAPES)
STOCKINETTE 6 STRL (DRAPES) IMPLANT
SUT ETHILON 3 0 FSL (SUTURE) IMPLANT
SUT ETHILON 3 0 PS 1 (SUTURE) IMPLANT
SUT NURALON 4 0 TR CR/8 (SUTURE) ×8 IMPLANT
SUT PROLENE 6 0 BV (SUTURE) IMPLANT
SUT SILK 0 TIES 10X30 (SUTURE) IMPLANT
SUT VIC AB 2-0 CP2 18 (SUTURE) ×8 IMPLANT
SUT VIC AB 3-0 FS2 27 (SUTURE) IMPLANT
SUT VICRYL 4-0 PS2 18IN ABS (SUTURE) IMPLANT
SYR CONTROL 10ML LL (SYRINGE) IMPLANT
TAPE CLOTH SURG 4X10 WHT LF (GAUZE/BANDAGES/DRESSINGS) ×4 IMPLANT
TOWEL OR 17X24 6PK STRL BLUE (TOWEL DISPOSABLE) ×4 IMPLANT
TOWEL OR 17X26 10 PK STRL BLUE (TOWEL DISPOSABLE) ×4 IMPLANT
TRAY FOLEY W/METER SILVER 16FR (SET/KITS/TRAYS/PACK) IMPLANT
TUBE CONNECTING 12'X1/4 (SUCTIONS)
TUBE CONNECTING 12X1/4 (SUCTIONS) IMPLANT
UNDERPAD 30X30 INCONTINENT (UNDERPADS AND DIAPERS) IMPLANT
WATER STERILE IRR 1000ML POUR (IV SOLUTION) ×4 IMPLANT

## 2015-12-16 NOTE — H&P (Signed)
Subjective: The patient is a 65 year old white male who presented with mental status changes. He had been worked up with a head CT and brain MRI which demonstrated a small left frontal lesion. This lesion was biopsied back in April and came back consistent with hypercellular gliosis. I discussed the situation with the patient and recommended that we repeat his MRI in a few months. Follow-up MRI demonstrated the lesion had enlarged significantly. The patient has had some progressive dysphasia and the behavioral changes. I discussed the situation with the patient is wife and recommended surgery. They have weighed the risks, benefits, and alternative surgery and decided to proceed with the craniotomy.  Past Medical History:  Diagnosis Date  . Arthritis   . Brain tumor ([place]) 8-9/17   unsure if cancerous at this time  . Diabetes mellitus without complication ([facility])    borderline  . Dysrhythmia    tachycardia  . GERD (gastroesophageal reflux disease)    takes OTC MEDS  . Headache   . Kidney stones    cant remember when  . Pre-diabetes   . Sleep apnea    back in 2000.  Hasn't been retested  . Tachycardia     Past Surgical History:  Procedure Laterality Date  . APPLICATION OF CRANIAL NAVIGATION N/A 08/17/2015   Procedure: APPLICATION OF CRANIAL NAVIGATION;  Surgeon: Newman Pies, MD;  Location: [facility]URO [facility]S;  Service: Neurosurgery;  Laterality: N/A;  . COLONOSCOPY    . HAS NEVER HAD     HAS NEVER HAD ANESTHESIA--NO SURGERIES  . PR DURAL GRAFT REPAIR,SPINE DEFECT Left 08/17/2015   Procedure: STEREOTACTIC BRAIN BIOPSY WITH [facility] NAVIGATION;  Surgeon: Newman Pies, MD;  Location: [facility]UR[facility] ORS;  Service: Neurosurgery;  Laterality: Left;  Stereotactic brain biopsy with brainlab    Allergies  Allergen Reactions  . No Known Allergies     Social History  Substance Use Topics  . Smoking status: Former Smoker    Packs/day: 1.00    Years: 70.00    Types: Cigarettes    Quit date:  12/08/2008  . Smokeless tobacco: Former Systems developer    Quit date: 08/13/2008  . Alcohol use No     Comment: SWITCHES BETWEEN BEVERAGES-- GOES IN SPELLS    No family history on file. Prior to Admission medications   Medication Sig Start Date End Date Taking? Authorizing Provider  dexamethasone (DECADRON) 2 MG tablet Take 2 mg by mouth 3 (three) times daily.   Yes Historical Provider, MD  diltiazem (CARDIZEM CD) 360 MG 24 hr capsule Take 1 capsule (360 mg total) by mouth daily. 08/20/15  Yes Newman Pies, MD  fluticasone [facility]) 50 MCG/ACT nasal spray Place 2 sprays into both nostrils daily as needed for allergies or rhinitis.   Yes Historical Provider, MD  metoprolol succinate (TOPROL-XL) 100 MG 24 hr tablet Take 1 tablet (100 mg total) by mouth daily. Take with or immediately following a meal. 08/20/15  Yes Newman Pies, MD  nortriptyline (PAMELOR) 10 MG capsule Take 10 mg by mouth at bedtime.    Yes Historical Provider, MD  rosuvastatin (CRESTOR) 10 MG tablet Take 10 mg by mouth daily.   Yes Historical Provider, MD  HYDROcodone-acetaminophen (NORCO/VICODIN) 5-325 MG tablet Take 1 tablet by mouth every 4 (four) hours as needed for moderate pain. Patient not taking: Reported on 12/03/2015 08/20/15   Newman Pies, MD  loratadine (CLARITIN) 10 MG tablet Take 10 mg by mouth daily as needed for allergies.    Historical Provider, MD  meloxicam (  MOBIC) 15 MG tablet Take 15 mg by mouth daily.    Historical Provider, MD     Review of Systems  Positive ROS: As above  All other systems have been reviewed and were otherwise negative with the exception of those mentioned in the HPI and as above.  Objective: Vital signs in last 24 hours: Temp:  [98 F (36.7 C)] 98 F (36.7 C) (08/16 0749) Pulse Rate:  [62] 62 (08/16 0749) Resp:  [20] 20 (08/16 0749) BP: (131)/(83) 131/83 (08/16 0749) SpO2:  [98 %] 98 % (08/16 0749) Weight:  [108.9 kg (240 lb)] 108.9 kg (240 lb) (08/16 0749)  General Appearance:  Alert, cooperative, no distress, Head: Normocephalic, without obvious abnormality, atraumatic. The patient's burr hole incision is well-healed. It is sunken. Eyes: PERRL, conjunctiva/corneas clear, EOM's intact,    Ears: Normal  Throat: Normal  Neck: Supple, symmetrical, trachea midline, no adenopathy; thyroid: No enlargement/tenderness/nodules; no carotid bruit or JVD Back: Symmetric, no curvature, ROM normal, no CVA tenderness Lungs: Clear to auscultation bilaterally, respirations unlabored Heart: Regular rate and rhythm, no murmur, rub or gallop Abdomen: Soft, non-tender,, no masses, no organomegaly Extremities: Extremities normal, atraumatic, no cyanosis or edema Pulses: 2+ and symmetric all extremities Skin: Skin color, texture, turgor normal, no rashes or lesions  NEUROLOGIC:   Mental status: The patient is alert and pleasant. He is dysphasic. Motor Exam - grossly normal Sensory Exam - grossly normal Reflexes:  Coordination - grossly normal Gait - grossly normal Balance - grossly normal Cranial Nerves: I: smell Not tested  II: visual acuity  OS: Normal  OD: Normal   II: visual fields Full to confrontation  II: pupils Equal, round, reactive to light  III,VII: ptosis None  III,IV,VI: extraocular muscles  Full ROM  V: mastication Normal  V: facial light touch sensation  Normal  V,VII: corneal reflex  Present  VII: facial muscle function - upper  Normal  VII: facial muscle function - lower Normal  VIII: hearing Not tested  IX: soft palate elevation  Normal  IX,X: gag reflex Present  XI: trapezius strength  5/5  XI: sternocleidomastoid strength 5/5  XI: neck flexion strength  5/5  XII: tongue strength  Normal    Data Review Lab Results  Component Value Date   WBC 18.9 (H) 12/09/2015   HGB 16.7 12/09/2015   HCT 47.9 12/09/2015   MCV 91.6 12/09/2015   PLT 272 12/09/2015   Lab Results  Component Value Date   NA 134 (L) 12/09/2015   K 4.3 12/09/2015   CL 100 (L)  12/09/2015   CO2 24 12/09/2015   BUN 24 (H) 12/09/2015   CREATININE 0.81 12/09/2015   GLUCOSE 140 (H) 12/09/2015   No results found for: INR, PROTIME  Assessment/Plan: Left frontal brain tumor: I have discussed the situation with the patient and his wife and reviewed his imaging studies with them. We have discussed the various treatment options including surgery. I have described the surgical treatment option of a left craniotomy for resection/debulking of the tumor. We have discussed the risks, benefits, alternatives, expected postoperative course, and likelihood of achieving our goals with surgery. I have answered all the patient's and his wife's questions. He has decided to proceed with surgery.   Baelyn Doring D 12/16/2015 10:10 AM

## 2015-12-16 NOTE — Progress Notes (Signed)
Upon arrival to unit, pt globally ahpasic. Pt's wife states that Dr Arnoldo Morale said he was speaking fine after surgery. Called Dr. Arnoldo Morale, no answer. Called Dr Cyndy Freeze (on call) orders received.

## 2015-12-16 NOTE — Anesthesia Postprocedure Evaluation (Signed)
Anesthesia Post Note  Patient: David Archer  Procedure(s) Performed: Procedure(s) (LRB): CRANIOTOMY LEFT FRONTAL (Left) APPLICATION OF CRANIAL NAVIGATION (N/A)  Patient location during evaluation: PACU Anesthesia Type: General Level of consciousness: awake Pain management: pain level controlled Vital Signs Assessment: post-procedure vital signs reviewed and stable Respiratory status: spontaneous breathing Cardiovascular status: stable Postop Assessment: no signs of nausea or vomiting Anesthetic complications: no     Last Vitals:  Vitals:   12/16/15 1430 12/16/15 1500  BP:    Pulse: (!) 55 (!) 52  Resp: 17 (!) 25  Temp:      Last Pain:  Vitals:   12/16/15 0749  TempSrc: Oral   Pain Goal: Patients Stated Pain Goal: 2 (12/16/15 0819)               Goldman Birchall JR,JOHN Mateo Flow

## 2015-12-16 NOTE — Anesthesia Procedure Notes (Signed)
Procedure Name: Intubation Date/Time: 12/16/2015 10:24 AM Performed by: Trixie Deis A Pre-anesthesia Checklist: Patient identified, Emergency Drugs available, Suction available, Timeout performed and Patient being monitored Patient Re-evaluated:Patient Re-evaluated prior to inductionOxygen Delivery Method: Circle system utilized Preoxygenation: Pre-oxygenation with 100% oxygen Intubation Type: IV induction Ventilation: Mask ventilation without difficulty and Oral airway inserted - appropriate to patient size Laryngoscope Size: Mac and 4 Grade View: Grade I Tube type: Oral Tube size: 7.5 mm Number of attempts: 1 Airway Equipment and Method: Stylet Placement Confirmation: ETT inserted through vocal cords under direct vision,  positive ETCO2 and breath sounds checked- equal and bilateral Secured at: 23 cm Tube secured with: Tape Dental Injury: Teeth and Oropharynx as per pre-operative assessment

## 2015-12-16 NOTE — Progress Notes (Signed)
Subjective:  The patient is alert and pleasant. He is in no apparent distress.  Objective: Vital signs in last 24 hours: Temp:  [98 F (36.7 C)-98.4 F (36.9 C)] 98.4 F (36.9 C) (08/16 1353) Pulse Rate:  [50-64] 50 (08/16 1645) Resp:  [17-30] 30 (08/16 1645) BP: (131-140)/(77-83) 140/77 (08/16 1405) SpO2:  [93 %-98 %] 96 % (08/16 1645) Arterial Line BP: (129-156)/(58-74) 156/67 (08/16 1600) Weight:  [108.9 kg (240 lb)] 108.9 kg (240 lb) (08/16 0749)  Intake/Output from previous day: No intake/output data recorded. Intake/Output this shift: Total I/O In: 1600 [I.V.:1600] Out: 725 [Urine:375; Drains:50; Blood:300]  Physical exam the patient is alert and attentive. He follows commands. He is a bit dysphasic. He will answer simple questions with yes and no.  Lab Results: No results for input(s): WBC, HGB, HCT, PLT in the last 72 hours. BMET No results for input(s): NA, K, CL, CO2, GLUCOSE, BUN, CREATININE, CALCIUM in the last 72 hours.  Studies/Results: No results found.  Assessment/Plan: The patient is doing well.  LOS: 0 days     Eliga Arvie D 12/16/2015, 5:03 PM

## 2015-12-16 NOTE — Progress Notes (Signed)
Subjective:  The patient is alert and pleasant. He looks well. He is in no apparent distress.  Objective: Vital signs in last 24 hours: Temp:  [98 F (36.7 C)-98.4 F (36.9 C)] 98.4 F (36.9 C) (08/16 1353) Pulse Rate:  [57-64] 64 (08/16 1405) Resp:  [18-21] 18 (08/16 1405) BP: (131-140)/(77-83) 140/77 (08/16 1405) SpO2:  [93 %-98 %] 97 % (08/16 1405) Arterial Line BP: (136-154)/(70-74) 154/74 (08/16 1405) Weight:  [108.9 kg (240 lb)] 108.9 kg (240 lb) (08/16 0749)  Intake/Output from previous day: No intake/output data recorded. Intake/Output this shift: Total I/O In: 1600 [I.V.:1600] Out: 725 [Urine:375; Drains:50; Blood:300]  Physical exam the patient is alert and pleasant. He is speaking. He is moving all 4 extremities well.  Lab Results: No results for input(s): WBC, HGB, HCT, PLT in the last 72 hours. BMET No results for input(s): NA, K, CL, CO2, GLUCOSE, BUN, CREATININE, CALCIUM in the last 72 hours.  Studies/Results: No results found.  Assessment/Plan: The patient is doing well. I spoke with his wife.  LOS: 0 days     David Archer D 12/16/2015, 2:09 PM

## 2015-12-16 NOTE — Addendum Note (Signed)
Addendum  created 12/16/15 1624 by Leonor Liv, CRNA   Anesthesia Intra Meds edited

## 2015-12-16 NOTE — Transfer of Care (Signed)
Immediate Anesthesia Transfer of Care Note  Patient: David Archer  Procedure(s) Performed: Procedure(s): CRANIOTOMY LEFT FRONTAL (Left) APPLICATION OF CRANIAL NAVIGATION (N/A)  Patient Location: PACU  Anesthesia Type:General  Level of Consciousness: awake, alert  and patient cooperative  Airway & Oxygen Therapy: Patient Spontanous Breathing and Patient connected to nasal cannula oxygen  Post-op Assessment: Report given to RN, Post -op Vital signs reviewed and stable and Patient moving all extremities  Post vital signs: Reviewed and stable  Last Vitals:  Vitals:   12/16/15 0749  BP: 131/83  Pulse: 62  Resp: 20  Temp: 36.7 C    Last Pain:  Vitals:   12/16/15 0749  TempSrc: Oral      Patients Stated Pain Goal: 2 (30/10/40 4591)  Complications: No apparent anesthesia complications

## 2015-12-16 NOTE — Op Note (Signed)
Brief history: The patient is a 65 year old white male who presented with a left frontal brain lesion. I performed a stereotactic brain biopsy on him a few months ago which came back consistent with hypercellular gliosis. A follow-up brain MRI demonstrated his left frontal lesion had enlarged to giving only. I discussed the situation with the patient and his wife. We discussed the various treatment options. I recommended surgery. They have weighed the risks, benefits, and alternatives surgery and decided to proceed with a craniotomy for debulking/resection of the tumor.  Pre-op diagnosis: Left frontal brain tumor  Postop diagnosis: The same (preliminary diagnosis is malignant tumor on frozen section)  Procedure: Left frontotemporal parietal craniotomy for gross total resection of left frontal brain tumor using a lab neuronavigation  Surgeon: Dr. Earle Gell  Assistant: Dr. Suezanne Jacquet Ditty  Anesthesia: Gen. endotracheal  Estimated blood loss: 125 mL  Specimens: Tumor  Drains: One subgaleal Jackson-Pratt drain  Complications: None  Description of procedure: The patient was brought to the operating room by the anesthesia team. General endotracheal anesthesia was induced. The patient remained in the supine position. I applied the Mayfield 3 point headrest to the patient's calvarium.  We then registered the patient's calvarium to the [place] computer.   The patient's left scalp was then prepared with Betadine scrub and Betadine solution. Sterile drapes were applied. I then injected the area to be incised with Marcaine with epinephrine solution. I used a scalpel to make a pterional type incision. I used Raney clips for wound edge hemostasis. I divided the temporalis fascia and muscle with electrocautery. I exposed the under lying calvarium with the periosteal elevators and electrocautery. I used towel clamps and rubber bands for exposure. We applied the buddy halo.  I then used a high-speed drill  to create a burr hole in the patient's left keyhole region and the left temporal region. I then used the foot plate device to create a frontotemporoparietal craniotomy flap. I elevated the craniotomy flap with Penfield #1 and Penfield #3. We confirmed the good placement of the craniotomy using the [facility] neuronavigational unit. I then incised the underlying dura with the Metzenbaum scissors. I used bipolar cautery and the 15 blade scalpel to create a corticotomy over the tumor. Using bipolar electrocautery and suction we dissected down to the tumor. The white matter was edematous. As expected we encountered a large cyst. Once we drained the cyst we had plenty of brain relaxation for the resection. The cyst and tumor was fairly well-circumscribed from the surrounding brain. We brought the operative microscope into the field. Under its magnification and illumination we completed the micro-dissection. We obtained a specimen and sent it to the pathologist for frozen section. He came back consistent with a malignant tumor. Using the [facility] neuro navigation, bipolar electrocautery and suction we were able to circumscribe the tumor and get a gross total resection back to edematous white matter. I then used bipolar electrocautery to provide hemostasis. We irrigated the tumor resection bed with saline solution. The irrigant came back crystal clear. We then lined the tumor resection cavity with Surgicel. We then reapproximated the patient's dura with interrupted 4 Nurolon suture. We then reapproximated the craniotomy flap with titanium mini plates and screws. I placed a burr hole cover over the patient's prior left frontal burr hole from his brain biopsy. We then removed the retractors. We reapproximated the patient's temporalis fascia and muscle with interrupted 2-0 Vicryl suture. I placed a 10 mm flat Jackson-Pratt drain in the sub-galeal/subtemporal space and  tunneled it out through a separate stab wound. We  reapproximated patient's galea with interrupted 2-0 Vicryl suture. We reapproximated the skin with stainless steel staples. We then coated the wound with bacitracin ointment. A sterile dressing was applied. The drapes were removed. I then removed the Mayfield 3 point head rest from the patient's calvarium.

## 2015-12-16 NOTE — Anesthesia Preprocedure Evaluation (Signed)
Anesthesia Evaluation  Patient identified by MRN, date of birth, ID band Patient awake    Reviewed: Allergy & Precautions, NPO status , Patient's Chart, lab work & pertinent test results, reviewed documented beta blocker date and time   Airway Mallampati: II  TM Distance: >3 FB Neck ROM: Full    Dental no notable dental hx.    Pulmonary sleep apnea , former smoker,    Pulmonary exam normal        Cardiovascular Normal cardiovascular exam+ dysrhythmias Supra Ventricular Tachycardia      Neuro/Psych  Headaches, negative psych ROS   GI/Hepatic Neg liver ROS, GERD  ,  Endo/Other  diabetes, Type 2  Renal/GU negative Renal ROS     Musculoskeletal  (+) Arthritis ,   Abdominal (+) + obese,   Peds  Hematology negative hematology ROS (+)   Anesthesia Other Findings   Reproductive/Obstetrics negative OB ROS                             Anesthesia Physical  Anesthesia Plan  ASA: III  Anesthesia Plan: General   Post-op Pain Management:    Induction: Intravenous  Airway Management Planned: Oral ETT  Additional Equipment: Arterial line  Intra-op Plan:   Post-operative Plan: Extubation in OR  Informed Consent: I have reviewed the patients History and Physical, chart, labs and discussed the procedure including the risks, benefits and alternatives for the proposed anesthesia with the patient or authorized representative who has indicated his/her understanding and acceptance.   Dental advisory given  Plan Discussed with: CRNA and Surgeon  Anesthesia Plan Comments:         Anesthesia Quick Evaluation

## 2015-12-17 ENCOUNTER — Inpatient Hospital Stay (HOSPITAL_COMMUNITY): Payer: BLUE CROSS/BLUE SHIELD

## 2015-12-17 DIAGNOSIS — R4702 Dysphasia: Secondary | ICD-10-CM

## 2015-12-17 DIAGNOSIS — I10 Essential (primary) hypertension: Secondary | ICD-10-CM

## 2015-12-17 DIAGNOSIS — I4891 Unspecified atrial fibrillation: Secondary | ICD-10-CM

## 2015-12-17 DIAGNOSIS — G936 Cerebral edema: Secondary | ICD-10-CM

## 2015-12-17 DIAGNOSIS — D496 Neoplasm of unspecified behavior of brain: Secondary | ICD-10-CM

## 2015-12-17 DIAGNOSIS — I610 Nontraumatic intracerebral hemorrhage in hemisphere, subcortical: Secondary | ICD-10-CM

## 2015-12-17 LAB — BASIC METABOLIC PANEL
ANION GAP: 16 — AB (ref 5–15)
BUN: 17 mg/dL (ref 6–20)
CALCIUM: 8.6 mg/dL — AB (ref 8.9–10.3)
CO2: 20 mmol/L — ABNORMAL LOW (ref 22–32)
CREATININE: 0.97 mg/dL (ref 0.61–1.24)
Chloride: 94 mmol/L — ABNORMAL LOW (ref 101–111)
GLUCOSE: 231 mg/dL — AB (ref 65–99)
Potassium: 5.2 mmol/L — ABNORMAL HIGH (ref 3.5–5.1)
Sodium: 130 mmol/L — ABNORMAL LOW (ref 135–145)

## 2015-12-17 LAB — GLUCOSE, CAPILLARY
GLUCOSE-CAPILLARY: 263 mg/dL — AB (ref 65–99)
GLUCOSE-CAPILLARY: 267 mg/dL — AB (ref 65–99)
Glucose-Capillary: 225 mg/dL — ABNORMAL HIGH (ref 65–99)

## 2015-12-17 LAB — HEMOGLOBIN A1C
Hgb A1c MFr Bld: 7.2 % — ABNORMAL HIGH (ref 4.8–5.6)
MEAN PLASMA GLUCOSE: 160 mg/dL

## 2015-12-17 MED ORDER — INSULIN ASPART 100 UNIT/ML ~~LOC~~ SOLN
0.0000 [IU] | SUBCUTANEOUS | Status: DC
  Administered 2015-12-17: 8 [IU] via SUBCUTANEOUS
  Administered 2015-12-18: 3 [IU] via SUBCUTANEOUS
  Administered 2015-12-18: 5 [IU] via SUBCUTANEOUS
  Administered 2015-12-18: 3 [IU] via SUBCUTANEOUS
  Administered 2015-12-18: 8 [IU] via SUBCUTANEOUS
  Administered 2015-12-18 (×3): 5 [IU] via SUBCUTANEOUS
  Administered 2015-12-19 (×2): 3 [IU] via SUBCUTANEOUS

## 2015-12-17 MED ORDER — INSULIN ASPART 100 UNIT/ML ~~LOC~~ SOLN: 0.0000 [IU] | Freq: Three times a day (TID) | SUBCUTANEOUS | Status: DC

## 2015-12-17 MED ORDER — DILTIAZEM HCL 25 MG/5ML IV SOLN
10.0000 mg | INTRAVENOUS | Status: AC
  Administered 2015-12-17: 10 mg via INTRAVENOUS
  Filled 2015-12-17: qty 5

## 2015-12-17 MED ORDER — SODIUM CHLORIDE 0.9 % IV SOLN
INTRAVENOUS | Status: DC
  Administered 2015-12-17 – 2015-12-18 (×3): via INTRAVENOUS

## 2015-12-17 MED ORDER — DILTIAZEM HCL 100 MG IV SOLR
5.0000 mg/h | INTRAVENOUS | Status: DC
  Administered 2015-12-17: 10 mg/h via INTRAVENOUS
  Administered 2015-12-18 (×2): 15 mg/h via INTRAVENOUS
  Filled 2015-12-17 (×4): qty 100

## 2015-12-17 MED ORDER — SODIUM CHLORIDE 0.9 % IV SOLN
750.0000 mg | Freq: Two times a day (BID) | INTRAVENOUS | Status: DC
  Administered 2015-12-17 – 2015-12-18 (×3): 750 mg via INTRAVENOUS
  Filled 2015-12-17 (×4): qty 7.5

## 2015-12-17 MED ORDER — DILTIAZEM HCL 25 MG/5ML IV SOLN: 5.0000 mg | INTRAVENOUS | Status: DC

## 2015-12-17 NOTE — Progress Notes (Signed)
Dr. Cyndy Freeze called to assess if patient's status had improved after keppra and decadron. Order received for keppra to be increased. Dr. Cyndy Freeze given neurology pager number to request to have an EEG performed.

## 2015-12-17 NOTE — Progress Notes (Signed)
Inpatient Diabetes Program Recommendations  AACE/ADA: New Consensus Statement on Inpatient Glycemic Control (2015)  Target Ranges:  Prepandial:   less than 140 mg/dL      Peak postprandial:   less than 180 mg/dL (1-2 hours)      Critically ill patients:  140 - 180 mg/dL  Results for [name], [name] (MRN [id number]) as of 12/17/2015 14:05  Ref. Range 12/17/2015 02:58  Glucose Latest Ref Range: 65 - 99 mg/dL 231 (H)   Results for [name], [name] (MRN [id number]) as of 12/17/2015 14:05  Ref. Range 12/16/2015 07:53 12/16/2015 13:48  Glucose-Capillary Latest Ref Range: 65 - 99 mg/dL 163 (H) 163 (H)   Results for [name], [name] (MRN [id number]) as of 12/17/2015 14:05  Ref. Range 12/16/2015 08:09  Hemoglobin A1C Latest Ref Range: 4.8 - 5.6 % 7.2 (H)   Review of Glycemic Control  Diabetes history: DM2 (diet controlled) Outpatient Diabetes medications: None Current orders for Inpatient glycemic control: None  Inpatient Diabetes Program Recommendations:  Correction (SSI): In reviewing chart, noted history of diabetes and that when patient was discharged from the hospital on 08/20/15 the discharge medication has Metformin 500 mg QAM prescribed. Patient is receiving Decadron which is contributing to hyperglycemia. Please order CBGs with Novolog correction scale Q4H. HgbA1C: A1C 7.2% on 12/16/15 indicating an average glucose of 160 mg/dl over the past 2-3 months. If patient is not taking any DM medications as an outpatient, will likely need to be prescribed DM medication for glycemic control.  Thanks, David Alderman, RN, MSN, CDE Diabetes Coordinator Inpatient Diabetes Program [phone] (Team Pager from [place] to [place]) [phone] ([facility] office) [phone] [facility] office) [phone] [facility] office)

## 2015-12-17 NOTE — Progress Notes (Signed)
[name], male   DOB: 04/24/1951, 65 y.o.   MRN: [id number] Subjective:  The patient is alert. He is a phasic. I don't see any seizure-like activity.  Objective: Vital signs in last 24 hours: Temp:  [97.2 F (36.2 C)-98.7 F (37.1 C)] 98.7 F (37.1 C) (08/17 0400) Pulse Rate:  [48-72] 67 (08/17 0600) Resp:  [12-30] 14 (08/17 0600) BP: (124-143)/(63-106) 132/80 (08/17 0600) SpO2:  [93 %-100 %] 98 % (08/17 0600) Arterial Line BP: (129-156)/(58-74) 156/67 (08/16 1600) Weight:  [108.9 kg (240 lb)] 108.9 kg (240 lb) (08/16 0749)  Intake/Output from previous day: 08/16 0701 - 08/17 0700 In: 3365 [I.V.:2905; IV Piggyback:460] Out: 3299 [Urine:3575; Drains:330; Blood:300] Intake/Output this shift: No intake/output data recorded.  Physical exam the patient is alert and attentive. He is moving all 4 extremities well. He is a phasic.  I have reviewed the patient's postoperative head CT. It demonstrates a good tumor resection. There is blood in the tumor resection cavity which does not appear to be creating any significant mass effect. The mass effect has significantly decreased from his preoperative MRI. He has edema in his left frontal lobe without much change from his preoperative MRI.  Lab Results: No results for input(s): WBC, HGB, HCT, PLT in the last 72 hours. BMET  Recent Labs  12/17/15 0258  NA 130*  K 5.2*  CL 94*  CO2 20*  GLUCOSE 231*  BUN 17  CREATININE 0.97  CALCIUM 8.6*    Studies/Results: Ct Head Wo Contrast  Result Date: 12/16/2015 CLINICAL DATA:  Craniotomy today.  aphasia. EXAM: CT HEAD WITHOUT CONTRAST TECHNIQUE: Contiguous axial images were obtained from the base of the skull through the vertex without intravenous contrast. COMPARISON:  MRI 12/09/2015 FINDINGS: Interval left frontal craniotomy. Partial resection of a large cystic mass in the left frontal lobe. There is some hemorrhage within the post resection space as would be expected.  The area of hemorrhage measures approximately 3.5 cm. Surrounding brain shows low density consistent with edema. I do not see evidence of infarction outside of the operative field or region of edema. There is diminished mass effect with right-to-left shift of only 3 mm presently. No hydrocephalus or intraventricular hemorrhage. No extra-axial collection. IMPRESSION: Good appearance following debulking of a left frontal tumor. Less mass effect with left-to-right shift 3 mm. Blood products in the surgical bed as expected. Low-density in the region consistent with residual edema. No finding to suggest infarction outside of the surgical field. Electronically Signed   By: Nelson Chimes M.D.   On: 12/16/2015 19:06    Assessment/Plan: Postop day #1: The patient was speaking better immediately after surgery. His postoperative scan demonstrates some blood in the tumor resection cavity. I think he is a aphasia is more likely secondary to some postoperative edema than it is from the blood clot in the tumor resection cavity. I therefore don't think he would benefit from removing the splint. We will continue with observation and steroids for now. We will await the postoperative MRI to check on the tumor resection.  LOS: 1 day     Jameila Keeny D 12/17/2015, 7:49 AM

## 2015-12-17 NOTE — Procedures (Signed)
ELECTROENCEPHALOGRAM REPORT  Date of Study: 12/17/2015  Patient's Name: David Archer MRN: [id number] Date of Birth: Jun 02, 1950  Referring Provider: Darrel Reach, MD  Clinical History: 65 year old man status post resection of left frontal lobe tumor with hemorrhage in the tumor bed with edema.  Patient is globally aphasic.  Medications: Decadron Keppra Norco Pamelor Morphine   Technical Summary: A multichannel digital EEG recording measured by the international 10-20 system with electrodes applied with paste and impedances below 5000 ohms performed as portable with EKG monitoring in an awake patient.  Hyperventilation and photic stimulation were not performed.  The digital EEG was referentially recorded, reformatted, and digitally filtered in a variety of bipolar and referential montages for optimal display.   Description: The patient is awake during the recording.  There is symmetric diffuse 4-5 Hz theta and 2-3 Hz delta slowing of the waking background without any discernible posterior dominant rhythm. There were no focal abnormalities.  There were no epileptiform discharges or electrographic seizures seen.    EKG lead was unremarkable.  Impression: This awake EEG is abnormal due to diffuse slowing of the waking background.  Clinical Correlation of the above findings indicates diffuse cerebral dysfunction that is non-specific in etiology and can be seen with hypoxic/ischemic injury, toxic/metabolic encephalopathies, neurodegenerative disorders, or medication effect.  The absence of epileptiform discharges does not rule out a clinical diagnosis of epilepsy.  Clinical correlation is advised.   Metta Clines, DO

## 2015-12-17 NOTE — Progress Notes (Addendum)
Patient continues to be aphasic. EEG without ongoing seizure activity. I agree with Dr. Arnoldo Morale that aphasia is likely multifactorial, no current evidence of seizure activity.  Certainly with persistent deficits, I would expect to see some findings on the EEG if this was due to ongoing seizure activity.  If there is continued concern for seizures, but a more prolonged study could be performed but I think this is relatively low yield at this time.  Neurology to sign off at this time but will follow up MRI, please call with further questions or concerns.  Roland Rack, MD [facility] [phone]  If 7pm- 7am, please page neurology on call as listed in [place].

## 2015-12-17 NOTE — Progress Notes (Signed)
[name], male   DOB: 01/24/1951, 65 y.o.   MRN: [id number] I spoke with the patient's wife. I told her that I think the patient's aphasia and confusion is likely secondary to combination of recent surgery, a malignant tumor, hyponatremia, and brain edema. I don't think the hematoma in the postoperative tumor resection bed is getting significant mass effect and minimal think removing the hematoma would be helpful. We will await the brain MRI to assess the tumor resection.

## 2015-12-17 NOTE — Evaluation (Signed)
Speech Language Pathology Evaluation Patient Details Name: David Archer MRN: [id number] DOB: 1951/03/03 Today's Date: 12/17/2015 Time: 1520-1550 SLP Time Calculation (min) (ACUTE ONLY): 30 min  Problem List:  Patient Active Problem List   Diagnosis Date Noted  . Atrial fibrillation ([place]) 08/18/2015  . Sleep apnea   . Diabetes mellitus without complication ([place])   . S/P craniotomy 08/17/2015  . Brain tumor ([place]) 08/17/2015   Past Medical History:  Past Medical History:  Diagnosis Date  . Arthritis   . Brain tumor ([place]) 8-9/17   unsure if cancerous at this time  . Diabetes mellitus without complication (HCC)    borderline  . Dysrhythmia    tachycardia  . GERD (gastroesophageal reflux disease)    takes OTC MEDS  . Headache   . Kidney stones    cant remember when  . Pre-diabetes   . Sleep apnea    back in 2000.  Hasn't been retested  . Tachycardia    Past Surgical History:  Past Surgical History:  Procedure Laterality Date  . APPLICATION OF CRANIAL NAVIGATION N/A 08/17/2015   Procedure: APPLICATION OF CRANIAL NAVIGATION;  Surgeon: Newman Pies, MD;  Location: [place] [facility];  Service: Neurosurgery;  Laterality: N/A;  . COLONOSCOPY    . HAS NEVER HAD     HAS NEVER HAD ANESTHESIA--NO SURGERIES  . PR DURAL GRAFT REPAIR,SPINE DEFECT Left 08/17/2015   Procedure: STEREOTACTIC BRAIN BIOPSY WITH Lucky Rathke NAVIGATION;  Surgeon: Newman Pies, MD;  Location: [place] [facility];  Service: Neurosurgery;  Laterality: Left;  Stereotactic brain biopsy with brainlab   HPI:  65 y/o m admitted for L crani of L frontal lesion.    Assessment / Plan / Recommendation Clinical Impression  Pt referred for clinical swallow assessment following crani for L frontal lobe lesion. Pt demonstrated intermittent s/s consistent with airway compromise- primarily intermittent immediate throat clear with ice chips/water. 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 Recommend: NPO with ongoing SLP assessment for PO  readiness. Discussed with pt/spouse and RN.     SLP Assessment       Follow Up Recommendations  Inpatient Rehab (pending progress and other needs)    Frequency and Duration min 2x/week         SLP Evaluation Prior Functioning      Cognition  Orientation Level: Other (comment) (UTA)    Comprehension       Expression     Oral / Motor  Oral Motor/Sensory Function Overall Oral Motor/Sensory Function: Moderate impairment (pt unable to participate with assessment? oral apraxia)   GO                    Vinetta Bergamo MA, CCC-SLP 12/17/2015, 4:09 PM

## 2015-12-17 NOTE — Consult Note (Signed)
Reason for Consult:atrial fib with a RVR  Referring Physician: Dr. Leone Payor is an 65 y.o. male.   HPI: The patient is a 65 yo man with hypercellular gliosis brain tumor who is s/p resection and developed post operative atrial fib. He has a remote h/o SVT and was found to have a brain lesion in January and underwent brain biopsy 4 months ago. He had atrial flutter with a RVR at that time was treated with beta blockers. He was noted to have enlarging brain tumor and underwent surgery. The patient has developed post op atrial fib with an RVR. Despite his rapid rate he has no evidence of distress, although he is a little restless in bed. Wife states he has chronic back pain and hospital bed makes this worse. He cannot answer any questions on my exam.  PMH: Past Medical History:  Diagnosis Date  . Arthritis   . Brain tumor ([place]) 8-9/17   unsure if cancerous at this time  . Diabetes mellitus without complication (HCC)    borderline  . Dysrhythmia    tachycardia  . GERD (gastroesophageal reflux disease)    takes OTC MEDS  . Headache   . Kidney stones    cant remember when  . Pre-diabetes   . Sleep apnea    back in 2000.  Hasn't been retested  . Tachycardia     PSHX: Past Surgical History:  Procedure Laterality Date  . APPLICATION OF CRANIAL NAVIGATION N/A 08/17/2015   Procedure: APPLICATION OF CRANIAL NAVIGATION;  Surgeon: Newman Pies, MD;  Location: [place] [facility];  Service: Neurosurgery;  Laterality: N/A;  . COLONOSCOPY    . HAS NEVER HAD     HAS NEVER HAD ANESTHESIA--NO SURGERIES  . PR DURAL GRAFT REPAIR,SPINE DEFECT Left 08/17/2015   Procedure: STEREOTACTIC BRAIN BIOPSY WITH Lucky Rathke NAVIGATION;  Surgeon: Newman Pies, MD;  Location: [facility];  Service: Neurosurgery;  Laterality: Left;  Stereotactic brain biopsy with brainlab    FAMHX:No family history on file. No premature CAD  Social History:  reports that he quit smoking about 7 years ago. His  smoking use included Cigarettes. He has a 70.00 pack-year smoking history. He quit smokeless tobacco use about 7 years ago. He reports that he does not drink alcohol or use drugs.  Allergies:  Allergies  Allergen Reactions  . No Known Allergies     Medications: I have reviewed the patient's current medications.  Ct Head Wo Contrast  Result Date: 12/16/2015 CLINICAL DATA:  Craniotomy today.  aphasia. EXAM: CT HEAD WITHOUT CONTRAST TECHNIQUE: Contiguous axial images were obtained from the base of the skull through the vertex without intravenous contrast. COMPARISON:  MRI 12/09/2015 FINDINGS: Interval left frontal craniotomy. Partial resection of a large cystic mass in the left frontal lobe. There is some hemorrhage within the post resection space as would be expected. The area of hemorrhage measures approximately 3.5 cm. Surrounding brain shows low density consistent with edema. I do not see evidence of infarction outside of the operative field or region of edema. There is diminished mass effect with right-to-left shift of only 3 mm presently. No hydrocephalus or intraventricular hemorrhage. No extra-axial collection. IMPRESSION: Good appearance following debulking of a left frontal tumor. Less mass effect with left-to-right shift 3 mm. Blood products in the surgical bed as expected. Low-density in the region consistent with residual edema. No finding to suggest infarction outside of the surgical field. Electronically Signed   By: Jan Fireman.D.  On: 12/16/2015 19:06    ROS  As stated in the HPI and negative for all other systems.  Physical Exam  Vitals:Blood pressure 121/72, pulse 78, temperature (!) 101 F (38.3 C), temperature source Oral, resp. rate 17, weight 240 lb (108.9 kg), SpO2 92 %.  stable appearing 65 yo man with a left sided head bandag, NAD HEENT: Unremarkable except for bandage over left side of his head Neck:  7 cm JVD, no thyromegally Lymphatics:  No adenopathy Back:   No CVA tenderness Lungs:  Clear with no wheezes HEART:  IRIR tachy, no murmurs, no rubs, no clicks Abd:  soft, positive bowel sounds, no organomegally, no rebound, no guarding Ext:  2 plus pulses, no edema, no cyanosis, no clubbing Skin:  No rashes no nodules Neuro:  CN II through XII intact, motor grossly intact  Tele - atrial fib with a RVR  ECG - atrial fib with a RVR  Assessment/Plan: 1. Post op atrial fib - would suggest IV cardizem, uptitrated to keep HR less than 120. No need for tight HR control. Will order a drip for 5-15 mg / hour which can be adjusted as needed. 2. HTN - his blood pressure is well controlled. If it goes too low in atrial fib, we can try IV digoxin. 3. Tobacco abuse - he quit smoking 7 years ago   Carleene Overlie TaylorMD 12/17/2015, 5:59 PM

## 2015-12-17 NOTE — Consult Note (Signed)
Neurology Consult Note  Reason for Consultation: post-op aphasia, ? seizure  Requesting provider: Ditty MD  CC: Patient nonverbal  HPI: This is a 14-yo man who experienced chronic headaches and a syncopal episode earlier this year prompting MRI scan of the brain. He was found have a mass lesion in the left frontal lobe that gradually enlarged in size. After body imaging did not reveal evidence of systemic malignancy, the decision was made to undergo stereotactic brain biopsy which was performed on 08/17/15. Pathology revealed hypercellular gliosis. Recent follow-up MRI scan demonstrated significant enlargement of his left frontal lesion. This was accompanied by progressive language difficulties and behavioral changes. The decision was made to proceed with craniotomy and resection of his mass lesion. 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 In the PACU after surgery, he was initially noted to be following commands and moving all 4 extremities. However, he had expressive language difficulties with limited verbal output according to the PACU nurse. He was transferred to the neuro ICU from the PACU for further monitoring. On arrival to the unit, he was noted to be globally aphasic, no longer following commands. CT scan of the head was obtained and showed postoperative changes with hemorrhage in the tumor bed and persistent left frontal lobe edema, no obvious acute infarction. Due to concerns that he could be having seizure activity, he was loaded with Keppra 1000 mg IV and started on Decadron 10 mg every 6 hours. Neurology consultation is now requested for further recommendations.  PMH:  Past Medical History:  Diagnosis Date  . Arthritis   . Brain tumor ([place]) 8-9/17   unsure if cancerous at this time  . Diabetes mellitus without complication (HCC)    borderline  . Dysrhythmia    tachycardia  . GERD (gastroesophageal reflux disease)    takes OTC  MEDS  . Headache   . Kidney stones    cant remember when  . Pre-diabetes   . Sleep apnea    back in 2000.  Hasn't been retested  . Tachycardia     PSH:  Past Surgical History:  Procedure Laterality Date  . APPLICATION OF CRANIAL NAVIGATION N/A 08/17/2015   Procedure: APPLICATION OF CRANIAL NAVIGATION;  Surgeon: Newman Pies, MD;  Location: [facility];  Service: Neurosurgery;  Laterality: N/A;  . COLONOSCOPY    . HAS NEVER HAD     HAS NEVER HAD ANESTHESIA--NO SURGERIES  . PR DURAL GRAFT REPAIR,SPINE DEFECT Left 08/17/2015   Procedure: STEREOTACTIC BRAIN BIOPSY WITH Lucky Rathke NAVIGATION;  Surgeon: Newman Pies, MD;  Location: [facility];  Service: Neurosurgery;  Laterality: Left;  Stereotactic brain biopsy with brainlab    Family history: No family history on file.  Social history:  Social History   Social History  . Marital status: Married    Spouse name: N/A  . Number of children: N/A  . Years of education: N/A   Occupational History  . Not on file.   Social History Main Topics  . Smoking status: Former Smoker    Packs/day: 1.00    Years: 70.00    Types: Cigarettes    Quit date: 12/08/2008  . Smokeless tobacco: Former Systems developer    Quit date: 08/13/2008  . Alcohol use No     Comment: SWITCHES BETWEEN BEVERAGES-- GOES IN SPELLS  . Drug use: No  . Sexual activity: Not on file   Other Topics Concern  . Not on file   Social History Narrative  .  No narrative on file    Current outpatient meds: Current Meds  Medication Sig  . dexamethasone (DECADRON) 2 MG tablet Take 2 mg by mouth 3 (three) times daily.  Marland Kitchen diltiazem (CARDIZEM CD) 360 MG 24 hr capsule Take 1 capsule (360 mg total) by mouth daily.  . fluticasone (FLONASE) 50 MCG/ACT nasal spray Place 2 sprays into both nostrils daily as needed for allergies or rhinitis.  . metoprolol succinate (TOPROL-XL) 100 MG 24 hr tablet Take 1 tablet (100 mg total) by mouth daily. Take with or immediately following a meal.   . nortriptyline (PAMELOR) 10 MG capsule Take 10 mg by mouth at bedtime.   . rosuvastatin (CRESTOR) 10 MG tablet Take 10 mg by mouth daily.    Current inpatient meds:  Current Facility-Administered Medications  Medication Dose Route Frequency Provider Last Rate Last Dose  . 0.9 % NaCl with KCl 20 mEq/ L  infusion   Intravenous Continuous Newman Pies, MD 75 mL/hr at 12/16/15 2010    . acetaminophen (TYLENOL) tablet 650 mg  650 mg Oral Q4H PRN Newman Pies, MD       Or  . acetaminophen (TYLENOL) suppository 650 mg  650 mg Rectal Q4H PRN Newman Pies, MD      . ceFAZolin (ANCEF) IVPB 2g/100 mL premix  2 g Intravenous Q8H Newman Pies, MD   2 g at 12/16/15 2009  . dexamethasone (DECADRON) injection 10 mg  10 mg Intravenous Q6H Newman Pies, MD   10 mg at 12/16/15 2356   Followed by  . dexamethasone (DECADRON) injection 8 mg  8 mg Intravenous Q6H Newman Pies, MD       Followed by  . [START ON 12/18/2015] dexamethasone (DECADRON) injection 4 mg  4 mg Intravenous Q8H Newman Pies, MD      . diltiazem (CARDIZEM CD) 24 hr capsule 360 mg  360 mg Oral Daily Newman Pies, MD      . docusate sodium (COLACE) capsule 100 mg  100 mg Oral BID Newman Pies, MD      . famotidine (PEPCID) IVPB 20 mg premix  20 mg Intravenous Q12H Newman Pies, MD   20 mg at 12/16/15 2225  . fluticasone (FLONASE) 50 MCG/ACT nasal spray 2 spray  2 spray Each Nare Daily PRN Newman Pies, MD      . HYDROcodone-acetaminophen (NORCO/VICODIN) 5-325 MG per tablet 1 tablet  1 tablet Oral Q4H PRN Newman Pies, MD      . labetalol (NORMODYNE,TRANDATE) injection 10-40 mg  10-40 mg Intravenous Q10 min PRN Newman Pies, MD      . lactated ringers infusion   Intravenous Continuous Lyn Hollingshead, MD   Stopped at 12/16/15 2010  . levETIRAcetam (KEPPRA) 750 mg in sodium chloride 0.9 % 100 mL IVPB  750 mg Intravenous Q12H Kevan Ny Ditty, MD      . loratadine (CLARITIN) tablet 10 mg  10 mg Oral  Daily PRN Newman Pies, MD      . metoprolol succinate (TOPROL-XL) 24 hr tablet 100 mg  100 mg Oral Daily Newman Pies, MD      . morphine 2 MG/ML injection 1-2 mg  1-2 mg Intravenous Q2H PRN Newman Pies, MD   1 mg at 12/16/15 2045  . nortriptyline (PAMELOR) capsule 10 mg  10 mg Oral QHS Newman Pies, MD      . ondansetron [place]) tablet 4 mg  4 mg Oral Q4H PRN Newman Pies, MD       Or  . ondansetron [facility])  injection 4 mg  4 mg Intravenous Q4H PRN Newman Pies, MD      . promethazine (PHENERGAN) tablet 12.5-25 mg  12.5-25 mg Oral Q4H PRN Newman Pies, MD      . rosuvastatin (CRESTOR) tablet 10 mg  10 mg Oral q1800 Newman Pies, MD        Allergies: Allergies  Allergen Reactions  . No Known Allergies     ROS: As per HPI. A full 14-point review of systems Cannot be obtained as the patient is unable to provide due to his aphasia.   PE:  BP 124/67   Pulse 72   Temp 97.6 F (36.4 C) (Axillary)   Resp 14   Wt 108.9 kg (240 lb)   SpO2 97%   BMI 33.47 kg/m   General: WDWN initially resting quietly with eyes closed. He opens his eyes readily to voice and will fix and track. He does not make any attempts to vocalize or speak. He does not follow any commands. Exam is thus limited.  HEENT: Dressings in place over left side of head from L frontal craniotomy. Drain in place draining serosanguinous fluid. Sclerae anicteric. Mild conjunctival injection. He does not open his mouth so I am unable to assess his oropharynx.  CV: Regular, no murmur. Distal pulses 2+ and symmetric.  Lungs: CTAB on anterior exam.  Abdomen: Soft, non-distended.  Extremities: No C/C/E. Neuro:  CN: Pupils are equal and round. They are symmetrically reactive from 3-->2 mm. He blinks to visual threat x4. He tracks with full eye movements. No nystagmus. No gaze deviation or preference. Corneals intact. He has some flattening of the R nasolabial fold. The remainder of his cranial nerves cannot be  assessed as he is unable to follow commands due to aphasia.  Motor: Normal bulk and tone. He moves all four extremities spontaneously but does not participate with confrontational strength testing. No tremor or other abnormal movements. Sensation: He grimaces and withdraws from mild noxious stimuli on each side.  DTRs: 2+, symmetric. Toes downgoing bilaterally.  Coordination: Unable to test as he does not follow commands due to his aphasia.    Labs:  Lab Results  Component Value Date   WBC 18.9 (H) 12/09/2015   HGB 16.7 12/09/2015   HCT 47.9 12/09/2015   PLT 272 12/09/2015   GLUCOSE 140 (H) 12/09/2015   ALT 68 (H) 12/09/2015   AST 24 12/09/2015   NA 134 (L) 12/09/2015   K 4.3 12/09/2015   CL 100 (L) 12/09/2015   CREATININE 0.81 12/09/2015   BUN 24 (H) 12/09/2015   CO2 24 12/09/2015   TSH 1.268 08/17/2015   HGBA1C 7.2 (H) 12/16/2015    Imaging:  I have personally and independently reviewed the [facility] without contrast from 12/16/15. He is s/p a left frontal craniotomy. There is hemorrhage in the operative bed in the left frontal lobe that measures about 3.5 cm at its greatest diameter. Significant vasogenic edema is noted in the left frontal lobe including the frontal operculum and extending into the insula as well. Mild pneumocephalus is present. No hydrocephalus, no midline shift.    Assessment and Plan:  1. Global aphasia: It sounds as if he has had some level of expressive aphasia due to his tumor even before his surgery. It's unclear how much this changed after surgery though it sounds as if it has worsened to the extent that he is now nonverbal. In addition, he is now having evidence of receptive language difficulties and no longer  following commands. Partial seizure activity is a possibility, particularly following craniotomy and tumor resection in the left frontal region. This could result in speech arrest and aphasia. This could also be a manifestation of postoperative neuronal  dysfunction given proximity of surgery to his language areas. He is able to fix and track and is not showing any evidence of generalized seizure activity on my exam. I agree with increasing his scheduled Keppra dose to 750 mg every 12 hours. I also agree with addition of high-dose Decadron given the degree of edema in the left perisylvian region. Consideration could be given to a trial of benzodiazepines and monitoring for improvement in symptoms after the administration which would support possible seizure activity. However, we do not want to sedate him excessively as we need to follow his examination. I will check a routine EEG in the morning. If this shows evidence of seizure activity, may consider continuous EEG monitoring.  2. Brain tumor: He had resection of a large left frontal mass. Pathology is pending. Management as per neurosurgery.  3. Cerebral edema: This is due to tumor and craniotomy. Agree with Decadron.  4. Intracranial hemorrhage: He has some bleeding into the tumor bed after his surgery. This may be contributing to his change in his neurologic status postoperatively. Treatment is supportive.  Thank for this consultation. Please feel free to call with any questions or concerns. Neurology will continue to follow.

## 2015-12-17 NOTE — Progress Notes (Signed)
Dr. Tommi Rumps notified that patient's HR continued to be in the 150's despite cardizem at '15mg'$ /hr. Order received to increase rate to 20 and call in a few hours if he had not yet converted to NSR.

## 2015-12-17 NOTE — Progress Notes (Signed)
EEG Completed; Results Pending  

## 2015-12-17 NOTE — Evaluation (Signed)
Speech Language Pathology Evaluation Patient Details Name: David Archer MRN: [id number] DOB: 15-Jun-1950 Today's Date: 12/17/2015 Time: 1520-1550 SLP Time Calculation (min) (ACUTE ONLY): 30 min  Problem List:  Patient Active Problem List   Diagnosis Date Noted  . Atrial fibrillation ([place]) 08/18/2015  . Sleep apnea   . Diabetes mellitus without complication ([place])   . S/P craniotomy 08/17/2015  . Brain tumor ([place]) 08/17/2015   Past Medical History:  Past Medical History:  Diagnosis Date  . Arthritis   . Brain tumor ([place]) 8-9/17   unsure if cancerous at this time  . Diabetes mellitus without complication (HCC)    borderline  . Dysrhythmia    tachycardia  . GERD (gastroesophageal reflux disease)    takes OTC MEDS  . Headache   . Kidney stones    cant remember when  . Pre-diabetes   . Sleep apnea    back in 2000.  Hasn't been retested  . Tachycardia    Past Surgical History:  Past Surgical History:  Procedure Laterality Date  . APPLICATION OF CRANIAL NAVIGATION N/A 08/17/2015   Procedure: APPLICATION OF CRANIAL NAVIGATION;  Surgeon: Newman Pies, MD;  Location: [place] [facility];  Service: Neurosurgery;  Laterality: N/A;  . COLONOSCOPY    . HAS NEVER HAD     HAS NEVER HAD ANESTHESIA--NO SURGERIES  . PR DURAL GRAFT REPAIR,SPINE DEFECT Left 08/17/2015   Procedure: STEREOTACTIC BRAIN BIOPSY WITH Lucky Rathke NAVIGATION;  Surgeon: Newman Pies, MD;  Location: [place] [facility];  Service: Neurosurgery;  Laterality: Left;  Stereotactic brain biopsy with brainlab   HPI:  65 y/o m admitted for L crani of L frontal lesion.    Assessment / Plan / Recommendation Clinical Impression  Pt referred for cognitive-linguistic assessment following L crani. Pt presents with global aphasia. Pt would benefit from ongoing SLP services.     SLP Assessment  Patient needs continued Speech Lanaguage Pathology Services    Follow Up Recommendations  Inpatient Rehab    Frequency and Duration min  2x/week  4 weeks      SLP Evaluation Prior Functioning  Cognitive/Linguistic Baseline: Baseline deficits Baseline deficit details:  (language/behavior)  Lives With: Spouse   Cognition  Overall Cognitive Status: Impaired/Different from baseline (assessment limited by language impairment) Arousal/Alertness: Awake/alert Orientation Level: Other (comment) (UTA) Attention: Sustained Sustained Attention: Impaired    Comprehension  Auditory Comprehension Overall Auditory Comprehension: Impaired (answered with an affirmative response to all ?s ) Yes/No Questions: Impaired Commands: Impaired (max A for single step with 20% acc) Interfering Components: Attention Reading Comprehension Reading Status: Not tested    Expression Verbal Expression Overall Verbal Expression: Impaired Initiation: Impaired Automatic Speech:  (unable to participate)   Oral / Motor  Oral Motor/Sensory Function Overall Oral Motor/Sensory Function: Moderate impairment Facial ROM:  (unable to participate with formal assessment, ? oral apraxia) Motor Speech Overall Motor Speech: Appears within functional limits for tasks assessed Respiration: Within functional limits Phonation: Normal Resonance: Within functional limits Intelligibility: Intelligible Motor Planning: Witnin functional limits   GO                    Vinetta Bergamo MA, [place] Pager [phone] 12/17/2015, 4:20 PM

## 2015-12-18 ENCOUNTER — Encounter (HOSPITAL_COMMUNITY): Payer: Self-pay | Admitting: Neurosurgery

## 2015-12-18 ENCOUNTER — Inpatient Hospital Stay (HOSPITAL_COMMUNITY): Payer: BLUE CROSS/BLUE SHIELD

## 2015-12-18 DIAGNOSIS — C7931 Secondary malignant neoplasm of brain: Secondary | ICD-10-CM

## 2015-12-18 DIAGNOSIS — Z808 Family history of malignant neoplasm of other organs or systems: Secondary | ICD-10-CM

## 2015-12-18 DIAGNOSIS — C3490 Malignant neoplasm of unspecified part of unspecified bronchus or lung: Secondary | ICD-10-CM

## 2015-12-18 LAB — GLUCOSE, CAPILLARY
GLUCOSE-CAPILLARY: 165 mg/dL — AB (ref 65–99)
GLUCOSE-CAPILLARY: 233 mg/dL — AB (ref 65–99)
GLUCOSE-CAPILLARY: 221 mg/dL — AB (ref 65–99)
Glucose-Capillary: 228 mg/dL — ABNORMAL HIGH (ref 65–99)
Glucose-Capillary: 231 mg/dL — ABNORMAL HIGH (ref 65–99)
Glucose-Capillary: 176 mg/dL — ABNORMAL HIGH (ref 65–99)

## 2015-12-18 LAB — BASIC METABOLIC PANEL
ANION GAP: 13 (ref 5–15)
BUN: 17 mg/dL (ref 6–20)
CALCIUM: 8.3 mg/dL — AB (ref 8.9–10.3)
CHLORIDE: 93 mmol/L — AB (ref 101–111)
CO2: 25 mmol/L (ref 22–32)
CREATININE: 0.79 mg/dL (ref 0.61–1.24)
GFR calc Af Amer: >60 mL/min (ref >60)
GFR calc non Af Amer: >60 mL/min (ref >60)
GLUCOSE: 169 mg/dL — AB (ref 65–99)
POTASSIUM: 4 mmol/L (ref 3.5–5.1)
Sodium: 131 mmol/L — ABNORMAL LOW (ref 135–145)

## 2015-12-18 LAB — MRSA PCR SCREENING: MRSA BY PCR: NEGATIVE

## 2015-12-18 LAB — MAGNESIUM: Magnesium: 2.1 mg/dL (ref 1.7–2.4)

## 2015-12-18 MED ORDER — DILTIAZEM HCL 100 MG IV SOLR
5.0000 mg/h | INTRAVENOUS | Status: DC
  Filled 2015-12-18: qty 100

## 2015-12-18 MED ORDER — FAMOTIDINE 20 MG PO TABS
20.0000 mg | ORAL_TABLET | Freq: Two times a day (BID) | ORAL | Status: DC
  Administered 2015-12-18 – 2015-12-19 (×3): 20 mg via ORAL
  Filled 2015-12-18 (×3): qty 1

## 2015-12-18 MED ORDER — DILTIAZEM HCL 60 MG PO TABS
60.0000 mg | ORAL_TABLET | Freq: Four times a day (QID) | ORAL | Status: DC
  Administered 2015-12-18 – 2015-12-19 (×5): 60 mg via ORAL
  Filled 2015-12-18 (×5): qty 1

## 2015-12-18 MED ORDER — LEVETIRACETAM 750 MG PO TABS
750.0000 mg | ORAL_TABLET | Freq: Two times a day (BID) | ORAL | Status: DC
  Administered 2015-12-18 – 2015-12-19 (×2): 750 mg via ORAL
  Filled 2015-12-18 (×2): qty 1

## 2015-12-18 NOTE — Progress Notes (Addendum)
Inpatient Diabetes Program Recommendations  AACE/ADA: New Consensus Statement on Inpatient Glycemic Control (2015)  Target Ranges:  Prepandial:   less than 140 mg/dL      Peak postprandial:   less than 180 mg/dL (1-2 hours)      Critically ill patients:  140 - 180 mg/dL   Lab Results  Component Value Date   GLUCAP 165 (H) 12/18/2015   HGBA1C 7.2 (H) 12/16/2015    Review of Glycemic ControlResults for KAINOA, SWOBODA (MRN [id number]) as of 12/18/2015 11:07  Ref. Range 12/17/2015 15:14 12/17/2015 19:50 12/17/2015 23:35 12/18/2015 03:38 12/18/2015 07:39  Glucose-Capillary Latest Ref Range: 65 - 99 mg/dL 225 (H) 263 (H) 267 (H) 228 (H) 165 (H)    Diabetes history: Borderline/diet controlled diabetes Outpatient Diabetes medications: None noted Current orders for Inpatient glycemic control:  Novolog moderate q 4 hours- note patient is also on Decadron  Inpatient Diabetes Program Recommendations: While on decadron and in the hospital, please consider adding Levemir 12 units bid.  Thanks, Adah Perl, RN, BC-ADM Inpatient Diabetes Coordinator Pager [phone] (8a-5p)

## 2015-12-18 NOTE — Progress Notes (Signed)
Subjective: No CP  Breathing is OK   Objective: Vitals:   12/18/15 0900 12/18/15 1000 12/18/15 1100 12/18/15 1200  BP: 1'15/62 94/66 98/66 '$ (!) 89/63  Pulse: 75 64 71 (!) 49  Resp: '19 18 18 '$ (!) 24  Temp:    98.2 F (36.8 C)  TempSrc:    Oral  SpO2: 95% 97% 95% (!) 88%  Weight:       Weight change:   Intake/Output Summary (Last 24 hours) at 12/18/15 1415 Last data filed at 12/18/15 1300  Gross per 24 hour  Intake          2202.59 ml  Output             1405 ml  Net           797.59 ml    General: Alert, awake, oriented x3, in no acute distress Neck:  JVP is normal Heart: Irregular rate and rhythm, without murmurs, rubs, gallops.  Lungs: Clear to auscultation.  No rales or wheezes. Exemities:  No edema.     TelE  Afib   90s    Lab Results: Results for orders placed or performed during the hospital encounter of 12/16/15 (from the past 24 hour(s))  Glucose, capillary     Status: Abnormal   Collection Time: 12/17/15  3:14 PM  Result Value Ref Range   Glucose-Capillary 225 (H) 65 - 99 mg/dL  Glucose, capillary     Status: Abnormal   Collection Time: 12/17/15  7:50 PM  Result Value Ref Range   Glucose-Capillary 263 (H) 65 - 99 mg/dL  Glucose, capillary     Status: Abnormal   Collection Time: 12/17/15 11:35 PM  Result Value Ref Range   Glucose-Capillary 267 (H) 65 - 99 mg/dL  Glucose, capillary     Status: Abnormal   Collection Time: 12/18/15  3:38 AM  Result Value Ref Range   Glucose-Capillary 228 (H) 65 - 99 mg/dL  Magnesium     Status: None   Collection Time: 12/18/15  7:33 AM  Result Value Ref Range   Magnesium 2.1 1.7 - 2.4 mg/dL  Basic metabolic panel     Status: Abnormal   Collection Time: 12/18/15  7:33 AM  Result Value Ref Range   Sodium 131 (L) 135 - 145 mmol/L   Potassium 4.0 3.5 - 5.1 mmol/L   Chloride 93 (L) 101 - 111 mmol/L   CO2 25 22 - 32 mmol/L   Glucose, Bld 169 (H) 65 - 99 mg/dL   BUN 17 6 - 20 mg/dL   Creatinine, Ser 0.79 0.61 - 1.24 mg/dL    Calcium 8.3 (L) 8.9 - 10.3 mg/dL   GFR calc non Af Amer >60 >60 mL/min   GFR calc Af Amer >60 >60 mL/min   Anion gap 13 5 - 15  Glucose, capillary     Status: Abnormal   Collection Time: 12/18/15  7:39 AM  Result Value Ref Range   Glucose-Capillary 165 (H) 65 - 99 mg/dL  Glucose, capillary     Status: Abnormal   Collection Time: 12/18/15 11:42 AM  Result Value Ref Range   Glucose-Capillary 231 (H) 65 - 99 mg/dL    Studies/Results: [name] Contrast  Result Date: 12/18/2015 CLINICAL DATA:  Continued surveillance following LEFT frontal resection. EXAM: MRI HEAD WITHOUT CONTRAST TECHNIQUE: Multiplanar, multiecho pulse sequences of the brain and surrounding structures were obtained without intravenous contrast. COMPARISON:  Multiple priors, most recent CT 12/16/2015. Most recent MR 12/09/2015. FINDINGS:  According to the technologist, the patient became unable to cooperate for completion of the exam. Contrast was not administered. When the patient becomes more stable, post infusion imaging could be performed as a separate exam. Today's examination is reported as a MR brain without. LEFT frontal resection cavity is redemonstrated. Restricted diffusion surrounding the cavity, most notable medially (image 20 series 4) represents perioperative ischemia. Moderate residual vasogenic edema. Left-to-right shift is measured at septum pellucidum level on image 16 is 3 mm. Acute and subacute blood products within the resection cavity are an expected postoperative finding. No significant extra-axial fluid. Baseline atrophy without significant chronic microvascular ischemic change. Unremarkable pituitary and cerebellar tonsils. Chronic LEFT maxillary sinus disease. Negative orbits. IMPRESSION: Only noncontrast images could be obtained. Baseline examination for postcontrast enhancement/assessment or residual tumor was not possible because the patient was unable to cooperate. Post infusion imaging could be  performed at a later date if the patient is able to remain motionless. Perioperative ischemia, most notable in the medial frontal lobe. Moderate residual vasogenic edema with only slight left-to-right shift. Electronically Signed   By: Staci Righter M.D.   On: 12/18/2015 13:52    MedicationsREviewed    '@PROBHOSP'$ @  1  Atrial fib/flutter  Post op rhythm  Rates controlled on diltiazem  Will switch to po and follow    LOS: 2 days   David Archer 12/18/2015, 2:15 PM

## 2015-12-18 NOTE — Progress Notes (Signed)
[name], male   DOB: Oct 27, 1950, 65 y.o.   MRN: [id number] I spoke with the patient's wife.  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  I will address the oncologist to see the patient and help [place] with further treatment.  I have answered all her questions.

## 2015-12-18 NOTE — Consult Note (Signed)
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 male presented with chronic headaches that have been there most of his adult life. He underwent a brain MRI in January 2017 which suggested 12 mm focus of signal abnormality in the left frontal white matter which was attributed to old small was an infarct. He subsequently had another MRI of the brain on 07/23/2015 that showed an enlarging left frontal lesion measuring 1.6 cm with peripheral enhancement and central necrosis and vasogenic edema. Stereotactic brain biopsy was performed on 08/17/2015 which showed hypercellular gliosis. Repeat MRI of the brain done on 11/24/2015 revealed the lesion measured 4.9 cm and the left frontal lobe with a smaller adjacent cyst measuring 15 mm with extensive vasogenic edema. He underwent craniotomy on 12/16/2015 by Dr. Arnoldo Morale. Thought she revealed small round blue cells that are TTF-1 positive suggestive of primary lung small cell carcinoma. CT scans done in April 2017 did not reveal any lesions in his body even in the lung. He does not have any pulmonary symptoms. Patient is sitting in the chair postoperatively and his wife is answering most questions. Patient is awake alert and oriented. He does exhibit mild confusion but he's able to walk to the bathroom by himself. Over the past couple of months patient's wife has noted that there were subtle changes in his behavior and forgetfulness and word finding difficulties.  MEDICAL HISTORY:  Past Medical History:  Diagnosis Date  . Arthritis   . Brain tumor ([facility]) 8-9/17   unsure if cancerous at this time  . Diabetes mellitus without complication (HCC)    borderline  . Dysrhythmia    tachycardia  . GERD (gastroesophageal reflux disease)     takes OTC MEDS  . Headache   . Kidney stones    cant remember when  . Pre-diabetes   . Sleep apnea    back in 2000.  Hasn't been retested  . Tachycardia     SURGICAL HISTORY: Past Surgical History:  Procedure Laterality Date  . APPLICATION OF CRANIAL NAVIGATION N/A 08/17/2015   Procedure: APPLICATION OF CRANIAL NAVIGATION;  Surgeon: Newman Pies, MD;  Location: [place] [facility];  Service: Neurosurgery;  Laterality: N/A;  . APPLICATION OF CRANIAL NAVIGATION N/A 12/16/2015   Procedure: APPLICATION OF CRANIAL NAVIGATION;  Surgeon: Newman Pies, MD;  Location: [place] [facility]S;  Service: Neurosurgery;  Laterality: N/A;  . COLONOSCOPY    . CRANIOTOMY Left 12/16/2015   Procedure: CRANIOTOMY LEFT FRONTAL;  Surgeon: Newman Pies, MD;  Location: [place] [facility] ORS;  Service: Neurosurgery;  Laterality: Left;  . HAS NEVER HAD     HAS NEVER HAD ANESTHESIA--NO SURGERIES  . PR DURAL GRAFT REPAIR,SPINE DEFECT Left 08/17/2015   Procedure: STEREOTACTIC BRAIN BIOPSY WITH [facility] NAVIGATION;  Surgeon: Newman Pies, MD;  Location: [place] [facility] OR[facility];  Service: Neurosurgery;  Laterality: Left;  Stereotactic brain biopsy with brainlab    SOCIAL HISTORY: Social History   Social History  . Marital status: Married    Spouse name: N/A  . Number of children: N/A  . Years of education: N/A   Occupational History  . Not on file.   Social History Main Topics  . Smoking status: Former Smoker    Packs/day: 1.00    Years: 70.00    Types: Cigarettes  Quit date: 12/08/2008  . Smokeless tobacco: Former Systems developer    Quit date: 08/13/2008  . Alcohol use No     Comment: SWITCHES BETWEEN BEVERAGES-- GOES IN SPELLS  . Drug use: No  . Sexual activity: Not on file   Other Topics Concern  . Not on file   Social History Narrative  . No narrative on file    FAMILY HISTORY: Patient's father was diagnosed with brain cancer  ALLERGIES:  is allergic to no known allergies.  MEDICATIONS:  Current Facility-Administered  Medications  Medication Dose Route Frequency Provider Last Rate Last Dose  . 0.9 %  sodium chloride infusion   Intravenous Continuous Newman Pies, MD 75 mL/hr at 12/18/15 0134    . acetaminophen (TYLENOL) tablet 650 mg  650 mg Oral Q4H PRN Newman Pies, MD       Or  . acetaminophen (TYLENOL) suppository 650 mg  650 mg Rectal Q4H PRN Newman Pies, MD      . dexamethasone (DECADRON) injection 4 mg  4 mg Intravenous Q8H Newman Pies, MD      . diltiazem (CARDIZEM) 100 mg in dextrose 5 % 100 mL (1 mg/mL) infusion  5-15 mg/hr Intravenous Titrated Fay Records, MD      . diltiazem (CARDIZEM) tablet 60 mg  60 mg Oral Q6H Fay Records, MD   60 mg at 12/18/15 1438  . docusate sodium (COLACE) capsule 100 mg  100 mg Oral BID Newman Pies, MD   100 mg at 12/18/15 1010  . famotidine (PEPCID) tablet 20 mg  20 mg Oral BID Newman Pies, MD   20 mg at 12/18/15 1010  . fluticasone (FLONASE) 50 MCG/ACT nasal spray 2 spray  2 spray Each Nare Daily PRN Newman Pies, MD      . HYDROcodone-acetaminophen (NORCO/VICODIN) 5-325 MG per tablet 1 tablet  1 tablet Oral Q4H PRN Newman Pies, MD      . insulin aspart (novoLOG) injection 0-15 Units  0-15 Units Subcutaneous Q4H Newman Pies, MD   5 Units at 12/18/15 1346  . labetalol (NORMODYNE,TRANDATE) injection 10-40 mg  10-40 mg Intravenous Q10 min PRN Newman Pies, MD      . levETIRAcetam (KEPPRA) tablet 750 mg  750 mg Oral BID Newman Pies, MD      . loratadine (CLARITIN) tablet 10 mg  10 mg Oral Daily PRN Newman Pies, MD      . metoprolol succinate (TOPROL-XL) 24 hr tablet 100 mg  100 mg Oral Daily Newman Pies, MD      . morphine 2 MG/ML injection 1-2 mg  1-2 mg Intravenous Q2H PRN Newman Pies, MD   2 mg at 12/18/15 0132  . nortriptyline (PAMELOR) capsule 10 mg  10 mg Oral QHS Newman Pies, MD      . ondansetron [facility]) tablet 4 mg  4 mg Oral Q4H PRN Newman Pies, MD       Or  . ondansetron [facility]) injection 4 mg  4  mg Intravenous Q4H PRN Newman Pies, MD      . promethazine (PHENERGAN) tablet 12.5-25 mg  12.5-25 mg Oral Q4H PRN Newman Pies, MD      . rosuvastatin (CRESTOR) tablet 10 mg  10 mg Oral q1800 Newman Pies, MD        REVIEW OF SYSTEMS:   Constitutional: Denies fevers, chills or abnormal night sweats Eyes: Denies blurriness of vision, double vision or watery eyes Ears, nose, mouth, throat, and face: Denies mucositis or sore throat Respiratory: Denies cough,  dyspnea or wheezes Cardiovascular: Irregular heart rate Gastrointestinal:  Denies nausea, heartburn or change in bowel habits Skin: Denies abnormal skin rashes Neurological:Able to answer questions, moves appropriately. No motor deficits. Behavioral/Psych: Mild confusion   All other systems were reviewed with the patient and are negative.  PHYSICAL EXAMINATION: ECOG PERFORMANCE STATUS: 1 - Symptomatic but completely ambulatory  Vitals:   12/18/15 1400 12/18/15 1559  BP: 118/78   Pulse: 93   Resp: (!) 23   Temp:  97.6 F (36.4 C)   Filed Weights   12/16/15 0749  Weight: 240 lb (108.9 kg)    GENERAL:alert, no distress and comfortable SKIN: skin color, texture, turgor are normal, no rashes or significant lesions EYES: normal, conjunctiva are pink and non-injected, sclera clear OROPHARYNX:no exudate, no erythema and lips, buccal mucosa, and tongue normal  NECK: supple, thyroid normal size, non-tender, without nodularity LYMPH:  no palpable lymphadenopathy in the cervical, axillary or inguinal LUNGS: clear to auscultation and percussion with normal breathing effort HEART:Irregular heart rate ABDOMEN:abdomen soft, non-tender and normal bowel sounds Musculoskeletal:no cyanosis of digits and no clubbing  NEURO: no focal motor/sensory deficits, mild confusion, surgical scars on his scalp in the left frontal area from recent surgery   LABORATORY DATA:  I have reviewed the data as listed Lab Results  Component Value  Date   WBC 18.9 (H) 12/09/2015   HGB 16.7 12/09/2015   HCT 47.9 12/09/2015   MCV 91.6 12/09/2015   PLT 272 12/09/2015   Lab Results  Component Value Date   NA 131 (L) 12/18/2015   K 4.0 12/18/2015   CL 93 (L) 12/18/2015   CO2 25 12/18/2015     ASSESSMENT AND PLAN:  Brain metastases: Small cell carcinoma that is TTF-1 positive and suggestive of lung primary. I discussed with the patient that he will need a whole body PET scan for staging purposes. He would also need radiation consultation for postoperative radiation therapy. He will need to be evaluated for systemic chemotherapy because of the fact that the cells appear to be from lung primary.  Atrial fibrillation: On diltiazem drip  I offered the patient follow up with Dr. Earlie Server or thoracic medical oncologist but the patient refused and wanted to be treated at [facility].  Patient's wife wanted the patient to be treated at [facility].I discussed with them that we will help facilitate the referral and consultation at [facility] for both radiation oncology and medical oncology. Thank you for involving [place] in his care. We will not make any outpatient follow-up visits with our [place] because of patient's preference to be treated at [facility].    Rulon Eisenmenger, MD '@T'$ @

## 2015-12-18 NOTE — Evaluation (Signed)
Physical Therapy Evaluation Patient Details Name: David Archer MRN: [id number] DOB: [birth year] Today's Date: 12/18/2015   History of Present Illness  65 y/o m admitted for L crani of L frontal lesion.  Clinical Impression  Patient demonstrates deficits in functional mobility as indicated below. Will need continued skilled PT to address deficits and maximize function. Will see as indicated and progress as tolerated. Recommend HHPT upon acute discharge. Wife in agreement.    Follow Up Recommendations Home health PT;Supervision/Assistance - 24 hour    Equipment Recommendations  Other (comment) (TBD)    Recommendations for Other Services       Precautions / Restrictions Precautions Precautions: Fall      Mobility  Bed Mobility Overal bed mobility: Needs Assistance Bed Mobility: Supine to Sit     Supine to sit: Min assist     General bed mobility comments: min assist for safety, cues for positioning  Transfers Overall transfer level: Needs assistance   Transfers: Sit to/from Stand Sit to Stand: Min guard         General transfer comment: min guard for stability and safety  Ambulation/Gait Ambulation/Gait assistance: Min guard Ambulation Distance (Feet): 30 Feet (in room) Assistive device: None Gait Pattern/deviations: Step-through pattern;Shuffle;Drifts right/left Gait velocity: decreased   General Gait Details: some modest instability noted with ambulation, no physical assist  Stairs            Wheelchair Mobility    Modified Rankin (Stroke Patients Only)       Balance Overall balance assessment: Needs assistance (able to perform SLS to operate foot pedal sink )   Sitting balance-Leahy Scale: Good       Standing balance-Leahy Scale: Fair Standing balance comment: min guard for static standing during use of bathroom                             Pertinent Vitals/Pain Pain Assessment: No/denies pain    Home Living  Family/patient expects to be discharged to:: Private residence Living Arrangements: Spouse/significant other Available Help at Discharge: Family;Available 24 hours/day Type of Home: House Home Access: Stairs to enter Entrance Stairs-Rails: Can reach both Entrance Stairs-Number of Steps: 6 Home Layout: One level Home Equipment: None      Prior Function                 Hand Dominance        Extremity/Trunk Assessment   Upper Extremity Assessment: Overall WFL for tasks assessed           Lower Extremity Assessment: Overall WFL for tasks assessed         Communication      Cognition Arousal/Alertness: Awake/alert Behavior During Therapy: WFL for tasks assessed/performed Overall Cognitive Status: Impaired/Different from baseline Area of Impairment: Orientation;Attention;Following commands;Safety/judgement;Awareness;Problem solving Orientation Level: Disoriented to;Time Current Attention Level: Sustained   Following Commands: Follows one step commands consistently;Follows multi-step commands with increased time Safety/Judgement: Decreased awareness of safety;Decreased awareness of deficits Awareness: Emergent Problem Solving: Difficulty sequencing;Requires verbal cues;Requires tactile cues General Comments: Patient required cues to direct to task, distracted with tactile stimuli    General Comments      Exercises        Assessment/Plan    PT Assessment Patient needs continued PT services  PT Diagnosis Difficulty walking;Altered mental status   PT Problem List Decreased balance;Decreased mobility;Decreased coordination;Decreased cognition;Decreased safety awareness  PT Treatment Interventions DME instruction;Gait training;Stair training;Functional mobility training;Therapeutic activities;Therapeutic exercise;Balance training;Patient/family  education   PT Goals (Current goals can be found in the Care Plan section) Acute Rehab PT Goals Patient Stated Goal:  to go home PT Goal Formulation: With patient/family Time For Goal Achievement: 01/01/16 Potential to Achieve Goals: Good    Frequency Min 3X/week   Barriers to discharge        Co-evaluation PT/OT/SLP Co-Evaluation/Treatment: Yes Reason for Co-Treatment: Necessary to address cognition/behavior during functional activity PT goals addressed during session: Mobility/safety with mobility         End of Session Equipment Utilized During Treatment: Gait belt Activity Tolerance: Patient tolerated treatment well Patient left: in chair;with call bell/phone within reach;with chair alarm set;with family/visitor present Nurse Communication: Mobility status         Time: 6256-3893 PT Time Calculation (min) (ACUTE ONLY): 17 min   Charges:   PT Evaluation $PT Eval Moderate Complexity: 1 Procedure     PT G CodesDuncan Dull 2016-01-04, 3:22 PM Alben Deeds, [place] DPT  [phone]

## 2015-12-18 NOTE — Progress Notes (Signed)
[name], male   DOB: November 24, 1950, 65 y.o.   MRN: [id number] Subjective:  The patient is alert and pleasant. His speech is much better today.  Objective: Vital signs in last 24 hours: Temp:  [98.2 F (36.8 C)-101 F (38.3 C)] 98.2 F (36.8 C) (08/18 0400) Pulse Rate:  [34-154] 88 (08/18 0600) Resp:  [13-20] 14 (08/18 0600) BP: (90-164)/(56-144) 164/64 (08/18 0600) SpO2:  [89 %-97 %] 95 % (08/18 0600)  Intake/Output from previous day: 08/17 0701 - 08/18 0700 In: 2192.2 [I.V.:1927.2; IV Piggyback:265] Out: 1955 [CBJSE:8315; Drains:105] Intake/Output this shift: No intake/output data recorded.  Physical exam the patient is alert and pleasant. He is oriented 2, person and [facility]. He is a bit dysphasic but speaking much better today. He is moving all 4 extremities well. His wound is healing well. I removed the drain.  Lab Results: No results for input(s): WBC, HGB, HCT, PLT in the last 72 hours. BMET  Recent Labs  12/17/15 0258  NA 130*  K 5.2*  CL 94*  CO2 20*  GLUCOSE 231*  BUN 17  CREATININE 0.97  CALCIUM 8.6*    Studies/Results: Ct Head Wo Contrast  Result Date: 12/16/2015 CLINICAL DATA:  Craniotomy today.  aphasia. EXAM: CT HEAD WITHOUT CONTRAST TECHNIQUE: Contiguous axial images were obtained from the base of the skull through the vertex without intravenous contrast. COMPARISON:  MRI 12/09/2015 FINDINGS: Interval left frontal craniotomy. Partial resection of a large cystic mass in the left frontal lobe. There is some hemorrhage within the post resection space as would be expected. The area of hemorrhage measures approximately 3.5 cm. Surrounding brain shows low density consistent with edema. I do not see evidence of infarction outside of the operative field or region of edema. There is diminished mass effect with right-to-left shift of only 3 mm presently. No hydrocephalus or intraventricular hemorrhage. No extra-axial collection.  IMPRESSION: Good appearance following debulking of a left frontal tumor. Less mass effect with left-to-right shift 3 mm. Blood products in the surgical bed as expected. Low-density in the region consistent with residual edema. No finding to suggest infarction outside of the surgical field. Electronically Signed   By: Nelson Chimes M.D.   On: 12/16/2015 19:06    Assessment/Plan: Postop day #2: The patient is doing much better today neurologically. We are awaiting the MRI to check on the extent of tumor resection. This needs to be done this morning.  Arrhythmia: I appreciate cardiology's input.  Hyponatremia: I'll repeat his BMP this morning.  LOS: 2 days     Landan Fedie D 12/18/2015, 7:47 AM

## 2015-12-18 NOTE — Progress Notes (Signed)
Speech Language Pathology Treatment: Dysphagia;Cognitive-Linquistic  Patient Details Name: ELLINGTON GREENSLADE MRN: [id number] DOB: 11/10/1950 Today's Date: 12/18/2015 Time: 0910-0921 SLP Time Calculation (min) (ACUTE ONLY): 11 min  Assessment / Plan / Recommendation Clinical Impression  Pt consumed regular and thin liquid trials for possible diet initiation. Swallow initiation timely, no pharyngeal residue suspected, no s/s aspiration. Pt able to state reason for admit with min cues; appears apathetic exhibiting decreased awareness to deficits, situation and abilities. Will add cognitive goals to treatment plan.    HPI HPI: 65 y/o m admitted for L crani of L frontal lesion.       SLP Plan  Continue with current plan of care     Recommendations  Diet recommendations: Regular;Thin liquid Liquids provided via: Cup;Straw Medication Administration: Whole meds with liquid Supervision: Patient able to self feed;Intermittent supervision to cue for compensatory strategies Compensations: Slow rate;Small sips/bites Postural Changes and/or Swallow Maneuvers: Seated upright 90 degrees             Oral Care Recommendations: Oral care BID Follow up Recommendations: Inpatient Rehab Plan: Continue with current plan of care                    Houston Siren 12/18/2015, 9:34 AM  Orbie Pyo Colvin Caroli.Ed [employer] [phone]

## 2015-12-18 NOTE — Progress Notes (Signed)
[name], male   DOB: 09/12/1950, 65 y.o.   MRN: [id number] I forgot to mention that we discussed the patient's postoperative brain MRI.  Although was done without contrast because the patient would not fully cooperate, it looks good.

## 2015-12-18 NOTE — Evaluation (Signed)
Occupational Therapy Evaluation Patient Details Name: David Archer MRN: [id number] DOB: 01-25-1951 Today's Date: 12/18/2015    History of Present Illness 65 y/o m admitted for L crani of L frontal lesion. PMHx: Arthrits, DM, Dysrhythmia, Tachycardia.   Clinical Impression   Pt reports he was independent with ADL PTA. Currently pt overall min guard with functional mobility and ADL with mod verbal cues for sequencing and visual scanning. Pt presenting with cognitive deficits and impaired balance in standing impacting his independence and safety with ADL and functional mobility. Pt planning to d/c home with 24/7 supervision from his wife. Recommending HHOT for follow up in order to maximize independence and safety with ADL and functional mobility upon return home. Pt would benefit from continued skilled OT to address established goals.    Follow Up Recommendations  Home health OT;Supervision/Assistance - 24 hour    Equipment Recommendations  Tub/shower seat    Recommendations for Other Services       Precautions / Restrictions Precautions Precautions: Fall Restrictions Weight Bearing Restrictions: No      Mobility Bed Mobility Overal bed mobility: Needs Assistance Bed Mobility: Supine to Sit     Supine to sit: Min assist     General bed mobility comments: min assist for safety, cues for positioning  Transfers Overall transfer level: Needs assistance Equipment used: None Transfers: Sit to/from Stand Sit to Stand: Min guard         General transfer comment: min guard for stability and safety    Balance Overall balance assessment: Needs assistance Sitting-balance support: Feet supported Sitting balance-Leahy Scale: Good     Standing balance support: No upper extremity supported;During functional activity Standing balance-Leahy Scale: Fair Standing balance comment: min guard for static standing during use of bathroom                             ADL Overall ADL's : Needs assistance/impaired Eating/Feeding: Set up;Supervision/ safety;Sitting   Grooming: Min guard;Standing;Wash/dry hands;Cueing for sequencing Grooming Details (indicate cue type and reason): Cues for soap and paper towel Upper Body Bathing: Min guard;Sitting   Lower Body Bathing: Min guard;Sit to/from stand   Upper Body Dressing : Min guard;Sitting   Lower Body Dressing: Minimal assistance;Sit to/from stand Lower Body Dressing Details (indicate cue type and reason): Wife assisting to don socks. Reports that was difficult for pt PTA. Toilet Transfer: Min guard;Ambulation;Regular Glass blower/designer Details (indicate cue type and reason): Simulated sit to stand from EOB. Pt able to stand at toilet to urinate with min guard assist. Toileting- Clothing Manipulation and Hygiene: Min guard;Sit to/from stand       Functional mobility during ADLs: Min guard General ADL Comments: Pt requires cues for sequencing tasks and for visual scanning to find items needed for ADL activity.     Vision Additional Comments: Appears WFL; needs to be further assessed with functional activities. Cueing required to find ADL items.   Perception     Praxis      Pertinent Vitals/Pain Pain Assessment: No/denies pain     Hand Dominance     Extremity/Trunk Assessment Upper Extremity Assessment Upper Extremity Assessment: Overall WFL for tasks assessed   Lower Extremity Assessment Lower Extremity Assessment: Overall WFL for tasks assessed   Cervical / Trunk Assessment Cervical / Trunk Assessment: Normal   Communication Communication Communication: No difficulties   Cognition Arousal/Alertness: Awake/alert Behavior During Therapy: WFL for tasks assessed/performed Overall Cognitive Status: Impaired/Different from baseline  Area of Impairment: Orientation;Attention;Following commands;Safety/judgement;Awareness;Problem solving Orientation Level: Disoriented  to;Time Current Attention Level: Sustained   Following Commands: Follows one step commands consistently;Follows multi-step commands with increased time Safety/Judgement: Decreased awareness of safety;Decreased awareness of deficits Awareness: Emergent Problem Solving: Difficulty sequencing;Requires verbal cues;Requires tactile cues General Comments: Patient required cues to direct to task, distracted with tactile stimuli   General Comments       Exercises       Shoulder Instructions      Home Living Family/patient expects to be discharged to:: Private residence Living Arrangements: Spouse/significant other Available Help at Discharge: Family;Available 24 hours/day Type of Home: House Home Access: Stairs to enter [employer] of Steps: 6 Entrance Stairs-Rails: Can reach both Home Layout: One level     Bathroom Shower/Tub: Teacher, early years/pre: Standard     Home Equipment: None      Lives With: Spouse    Prior Functioning/Environment Level of Independence: Independent             OT Diagnosis: Generalized weakness;Altered mental status   OT Problem List: Impaired balance (sitting and/or standing);Decreased cognition;Decreased safety awareness;Decreased knowledge of use of DME or AE   OT Treatment/Interventions: Self-care/ADL training;Energy conservation;DME and/or AE instruction;Therapeutic activities;Patient/family education;Balance training;Cognitive remediation/compensation    OT Goals(Current goals can be found in the care plan section) Acute Rehab OT Goals Patient Stated Goal: to go home OT Goal Formulation: With patient/family Time For Goal Achievement: 01/01/16 Potential to Achieve Goals: Good ADL Goals Pt Will Transfer to Toilet: with modified independence;ambulating;regular height toilet Pt Will Perform Tub/Shower Transfer: Tub transfer;with supervision;ambulating;shower seat Additional ADL Goal #1: Pt will demonstate selective  attention during ADL task with min verbal cues.  OT Frequency: Min 2X/week   Barriers to D/C:            Co-evaluation PT/OT/SLP Co-Evaluation/Treatment: Yes Reason for Co-Treatment: Necessary to address cognition/behavior during functional activity PT goals addressed during session: Mobility/safety with mobility OT goals addressed during session: ADL's and self-care      End of Session Equipment Utilized During Treatment: Gait belt Nurse Communication: Mobility status (RN present to observe functional mobility)  Activity Tolerance: Patient tolerated treatment well Patient left: in chair;with call bell/phone within reach;with chair alarm set;with family/visitor present   Time: 1451-1511 OT Time Calculation (min): 20 min Charges:  OT General Charges $OT Visit: 1 Procedure OT Evaluation $OT Eval Moderate Complexity: 1 Procedure G-Codes:     Binnie Kand M.S., OTR/L Pager: [phone]  12/18/2015, 4:46 PM

## 2015-12-19 ENCOUNTER — Encounter (HOSPITAL_COMMUNITY): Payer: Self-pay

## 2015-12-19 LAB — GLUCOSE, CAPILLARY
GLUCOSE-CAPILLARY: 161 mg/dL — AB (ref 65–99)
GLUCOSE-CAPILLARY: 172 mg/dL — AB (ref 65–99)
GLUCOSE-CAPILLARY: 240 mg/dL — AB (ref 65–99)

## 2015-12-19 MED ORDER — METOPROLOL TARTRATE 5 MG/5ML IV SOLN
5.0000 mg | Freq: Once | INTRAVENOUS | Status: AC
  Administered 2015-12-19: 5 mg via INTRAVENOUS
  Filled 2015-12-19: qty 5

## 2015-12-19 MED ORDER — HYDROCODONE-ACETAMINOPHEN 5-325 MG PO TABS: 1.0000 | ORAL_TABLET | ORAL | 0 refills | Status: DC | PRN

## 2015-12-19 MED ORDER — METOPROLOL TARTRATE 5 MG/5ML IV SOLN
5.0000 mg | Freq: Once | INTRAVENOUS | Status: AC
  Administered 2015-12-19: 5 mg via INTRAVENOUS

## 2015-12-19 MED ORDER — PNEUMOCOCCAL VAC POLYVALENT 25 MCG/0.5ML IJ INJ
0.5000 mL | INJECTION | INTRAMUSCULAR | Status: AC | PRN
  Administered 2015-12-19: 0.5 mL via INTRAMUSCULAR

## 2015-12-19 MED ORDER — PNEUMOCOCCAL VAC POLYVALENT 25 MCG/0.5ML IJ INJ: 0.5000 mL | INJECTION | INTRAMUSCULAR | Status: DC

## 2015-12-19 MED ORDER — METOPROLOL TARTRATE 5 MG/5ML IV SOLN
INTRAVENOUS | Status: AC
  Filled 2015-12-19: qty 5

## 2015-12-19 MED ORDER — DEXAMETHASONE 2 MG PO TABS: 2.0000 mg | ORAL_TABLET | Freq: Two times a day (BID) | ORAL | 1 refills | Status: DC

## 2015-12-19 NOTE — Care Management Note (Signed)
Case Management Note  Patient Details  Name: David Archer MRN: [id number] Date of Birth: 03/04/1951  Subjective/Objective:                  Active Problems:   Brain tumor [facility])  Action/Plan: Cm called and spoke with Dr. Earnie Larsson who ordered [facility] PT/OTand tub bench but said that he may or may not go today. CM offered patient and fmaily choice and [facility]. CM called [facility] to advise of referral for PT/OT which was accepted and tub bench requested to the bedside. Wife at the bedside and denied further needs at this time.   Expected Discharge Date:  12/19/15               Expected Discharge Plan:  [place]  In-House Referral:     Discharge planning Services  CM Consult  Post Acute Care Choice:  Durable Medical Equipment, Home Health Choice offered to:  Patient  DME Arranged:  Tub bench DME Agency:  [place] Arranged:  PT, OT [facility] Agency:  [employer]  Status of Service:  In process, will continue to follow  If discussed at Long Length of Stay Meetings, dates discussed:    Additional Comments:  Guido Sander, RN 12/19/2015, 11:33 AM

## 2015-12-19 NOTE — Discharge Summary (Signed)
Physician Discharge Summary  Patient ID: NAS WAFER MRN: [id number] DOB/AGE: October 20, 1950 65 y.o.  Admit date: 12/16/2015 Discharge date: 12/19/2015  Admission Diagnoses:  Discharge Diagnoses:  Active Problems:   Brain tumor [facility])   Discharged Condition: good  Hospital Course: Patient admitted to the hospital for evaluation of a left frontal mass. Workup demonstrates left frontal ring-enhancing mass. Patient underwent craniotomy and resection of tumor. Pathology consistent with small cell carcinoma. Additional therapies to follow. Patient has elected to proceed with oncology management at [facility]. Postoperative patient is done well. He has minimal headache. He still has some mild aphasia but this is clearing. He has no motor or sensory complaints. He is up ambulating without difficulty and feels ready for discharge home.  Consults:   Significant Diagnostic Studies:   Treatments:   Discharge Exam: Blood pressure 96/70, pulse 75, temperature 97.4 F (36.3 C), temperature source Axillary, resp. rate 19, weight 108.9 kg (240 lb), SpO2 98 %. Patient is awake and alert. He is oriented and appropriate. His cranial nerve function is intact. Motor exam 5/5 bilaterally with no pronator drift. Chest and abdomen are benign. Extremities are free from injury deformity. Wound healing well.  Disposition: 01-Home or Self Care     Medication List    TAKE these medications   dexamethasone 2 MG tablet Commonly known as:  DECADRON Take 1 tablet (2 mg total) by mouth 2 (two) times daily. What changed:  when to take this   diltiazem 360 MG 24 hr capsule Commonly known as:  CARDIZEM CD Take 1 capsule (360 mg total) by mouth daily.   fluticasone 50 MCG/ACT nasal spray Commonly known as:  FLONASE Place 2 sprays into both nostrils daily as needed for allergies or rhinitis.   HYDROcodone-acetaminophen 5-325 MG tablet Commonly known as:  NORCO/VICODIN Take 1-2 tablets by mouth every 4  (four) hours as needed for moderate pain. What changed:  how much to take   loratadine 10 MG tablet Commonly known as:  CLARITIN Take 10 mg by mouth daily as needed for allergies.   meloxicam 15 MG tablet Commonly known as:  MOBIC Take 15 mg by mouth daily.   metoprolol succinate 100 MG 24 hr tablet Commonly known as:  TOPROL-XL Take 1 tablet (100 mg total) by mouth daily. Take with or immediately following a meal.   nortriptyline 10 MG capsule Commonly known as:  PAMELOR Take 10 mg by mouth at bedtime.   rosuvastatin 10 MG tablet Commonly known as:  CRESTOR Take 10 mg by mouth daily.      Follow-up Information    Ophelia Charter, MD. Schedule an appointment as soon as possible for a visit in 1 week(s).   Specialty:  Neurosurgery Contact information: [address] [place] [address] [phone]        Ophelia Charter, MD .   Specialty:  Neurosurgery Contact information: [address] [place] [address] [phone]           Signed: Charlie Pitter 12/19/2015, 7:58 AM

## 2015-12-19 NOTE — Progress Notes (Signed)
Cardiologist: Dr. 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 He has a remote h/o SVT and was found to have a brain lesion in January and underwent brain biopsy 4 months ago. He had atrial flutter with a RVR at that time was treated with beta blockers. He was noted to have enlarging brain tumor and underwent surgery. The patient has developed post op atrial fib with an RVR. Despite his rapid rate he has no evidence of distress.  He is in chair and does not feel palpitations. HR has fluctuated recently. Now 165bpm 2:1 atrial flutter.   Wife in room.    Objective:  Vital Signs in the last 24 hours: Temp:  [97.4 F (36.3 C)-98.2 F (36.8 C)] 97.6 F (36.4 C) (08/19 0800) Pulse Rate:  [35-93] 57 (08/19 0900) Resp:  [9-24] 20 (08/19 1000) BP: (86-118)/(57-83) 105/64 (08/19 1000) SpO2:  [88 %-98 %] 96 % (08/19 0900)  Intake/Output from previous day: 08/18 0701 - 08/19 0700 In: 2075.4 [I.V.:1967.9; IV Piggyback:107.5] Out: -    Physical Exam: General: Well developed, well nourished, in no acute distress. Head:  Wound noted. Lungs: Clear to auscultation and percussion. Heart: Tachy Reg.  No murmur, rubs or gallops.  Abdomen: soft, non-tender, positive bowel sounds. Extremities: No clubbing or cyanosis. No edema. Neurologic: Alert     Lab Results: No results for input(s): WBC, HGB, PLT in the last 72 hours.  Recent Labs  12/17/15 0258 12/18/15 0733  NA 130* 131*  K 5.2* 4.0  CL 94* 93*  CO2 20* 25  GLUCOSE 231* 169*  BUN 17 17  CREATININE 0.97 0.79    Imaging: [name] Contrast  Result Date: 12/18/2015 CLINICAL DATA:  Continued surveillance following LEFT frontal resection. EXAM: MRI HEAD WITHOUT CONTRAST TECHNIQUE: Multiplanar, multiecho pulse sequences of the brain and surrounding structures were obtained without intravenous contrast. COMPARISON:  Multiple priors, most  recent CT 12/16/2015. Most recent MR 12/09/2015. FINDINGS: According to the technologist, the patient became unable to cooperate for completion of the exam. Contrast was not administered. When the patient becomes more stable, post infusion imaging could be performed as a separate exam. Today's examination is reported as a MR brain without. LEFT frontal resection cavity is redemonstrated. Restricted diffusion surrounding the cavity, most notable medially (image 20 series 4) represents perioperative ischemia. Moderate residual vasogenic edema. Left-to-right shift is measured at septum pellucidum level on image 16 is 3 mm. Acute and subacute blood products within the resection cavity are an expected postoperative finding. No significant extra-axial fluid. Baseline atrophy without significant chronic microvascular ischemic change. Unremarkable pituitary and cerebellar tonsils. Chronic LEFT maxillary sinus disease. Negative orbits. IMPRESSION: Only noncontrast images could be obtained. Baseline examination for postcontrast enhancement/assessment or residual tumor was not possible because the patient was unable to cooperate. Post infusion imaging could be performed at a later date if the patient is able to remain motionless. Perioperative ischemia, most notable in the medial frontal lobe. Moderate residual vasogenic edema with only slight left-to-right shift. Electronically Signed   By: Staci Righter M.D.   On: 12/18/2015 13:52   Personally viewed.   Telemetry: 165 bpm A flutter. Personally viewed.   EKG:  AFIB Personally viewed.  Cardiac Studies:  ECHO EF 50-55%  Meds: Scheduled Meds: . dexamethasone  4 mg Intravenous Q8H  . diltiazem  60 mg Oral Q6H  . docusate sodium  100 mg Oral BID  .  famotidine  20 mg Oral BID  . insulin aspart  0-15 Units Subcutaneous Q4H  . levETIRAcetam  750 mg Oral BID  . metoprolol succinate  100 mg Oral Daily  . nortriptyline  10 mg Oral QHS  . rosuvastatin  10 mg Oral  q1800   Continuous Infusions: . sodium chloride 75 mL/hr at 12/18/15 1948  . diltiazem (CARDIZEM) infusion     PRN Meds:.acetaminophen **OR** acetaminophen, fluticasone, HYDROcodone-acetaminophen, labetalol, loratadine, morphine injection, ondansetron **OR** ondansetron (ZOFRAN) IV, promethazine  Assessment/Plan:  Active Problems:   Brain tumor (HCC)   AFIB/Flutter  - Received IV metoprolol '5mg'$  at 11AM and will get another dose now 11:30am. HR has started to slow down. Asymptomatic.  - Has been on Toprol 100 QD at home.   - Diltiazem '60mg'$  PO Q6  - Would consolidate to diltiazem '360mg'$  CD when DC home.   - normal EF.   - no anticoagulation (brain mass)  If heart rate becomes controlled, OK to DC home. Careful with BP.    Candee Furbish 12/19/2015, 11:24 AM

## 2015-12-19 NOTE — Progress Notes (Signed)
Pt was in rate controlled A-fib prior to ambulation. Pt received discharge orders from neurosurgery. He ambulated in hall with the nurse tech and was reconnected to the monitor following ambulation. His HR was sustaining 170 while seated. Pt denied any discomfort and 1000 dose of 100 mg of metoprolol was given PO. Dr. Annette Stable of neurosurgery was notified and instructions were received to give pt's 1200 dose of 60 mg of diltiazem early. Contacted cardiology and received order to give 5 mg metoprolol IV. Dose given. Pt's HR sustaining in the 160s. Will continue to monitor and update as needed.

## 2015-12-19 NOTE — Progress Notes (Signed)
Pt's HR is maintaining in the 80s in a-fib. Per Dr. Marlou Porch and Dr. Annette Stable, ok to discharge patient to home.

## 2015-12-23 ENCOUNTER — Telehealth: Payer: Self-pay | Admitting: Hematology and Oncology

## 2015-12-23 NOTE — Telephone Encounter (Signed)
FAXED PT RECORDS TO [place].

## 2015-12-28 ENCOUNTER — Encounter: Payer: Self-pay | Admitting: Emergency Medicine

## 2015-12-28 ENCOUNTER — Inpatient Hospital Stay
Admission: EM | Admit: 2015-12-28 | Discharge: 2015-12-31 | DRG: 308 | Disposition: A | Discharge status: Home / Self Care | Payer: BLUE CROSS/BLUE SHIELD | Attending: Internal Medicine | Admitting: Internal Medicine

## 2015-12-28 DIAGNOSIS — M1990 Unspecified osteoarthritis, unspecified site: Secondary | ICD-10-CM | POA: Diagnosis present

## 2015-12-28 DIAGNOSIS — E1310 Other specified diabetes mellitus with ketoacidosis without coma: Secondary | ICD-10-CM | POA: Diagnosis present

## 2015-12-28 DIAGNOSIS — C711 Malignant neoplasm of frontal lobe: Secondary | ICD-10-CM | POA: Diagnosis present

## 2015-12-28 DIAGNOSIS — I4891 Unspecified atrial fibrillation: Secondary | ICD-10-CM | POA: Diagnosis not present

## 2015-12-28 DIAGNOSIS — K219 Gastro-esophageal reflux disease without esophagitis: Secondary | ICD-10-CM | POA: Diagnosis present

## 2015-12-28 DIAGNOSIS — R000 Tachycardia, unspecified: Secondary | ICD-10-CM | POA: Diagnosis present

## 2015-12-28 DIAGNOSIS — I480 Paroxysmal atrial fibrillation: Principal | ICD-10-CM | POA: Diagnosis present

## 2015-12-28 DIAGNOSIS — E871 Hypo-osmolality and hyponatremia: Secondary | ICD-10-CM | POA: Diagnosis present

## 2015-12-28 DIAGNOSIS — Z7951 Long term (current) use of inhaled steroids: Secondary | ICD-10-CM

## 2015-12-28 DIAGNOSIS — Z87891 Personal history of nicotine dependence: Secondary | ICD-10-CM

## 2015-12-28 DIAGNOSIS — Z833 Family history of diabetes mellitus: Secondary | ICD-10-CM

## 2015-12-28 DIAGNOSIS — G4733 Obstructive sleep apnea (adult) (pediatric): Secondary | ICD-10-CM | POA: Diagnosis present

## 2015-12-28 DIAGNOSIS — D696 Thrombocytopenia, unspecified: Secondary | ICD-10-CM | POA: Diagnosis present

## 2015-12-28 DIAGNOSIS — Z9889 Other specified postprocedural states: Secondary | ICD-10-CM

## 2015-12-28 DIAGNOSIS — I248 Other forms of acute ischemic heart disease: Secondary | ICD-10-CM | POA: Diagnosis present

## 2015-12-28 DIAGNOSIS — Z79899 Other long term (current) drug therapy: Secondary | ICD-10-CM

## 2015-12-28 DIAGNOSIS — I4892 Unspecified atrial flutter: Secondary | ICD-10-CM | POA: Diagnosis present

## 2015-12-28 DIAGNOSIS — Z87442 Personal history of urinary calculi: Secondary | ICD-10-CM

## 2015-12-28 DIAGNOSIS — E1110 Type 2 diabetes mellitus with ketoacidosis without coma: Secondary | ICD-10-CM

## 2015-12-28 LAB — TROPONIN I: Troponin I: 0.07 ng/mL (ref <0.03)

## 2015-12-28 LAB — PROTIME-INR
INR: 1.02
Prothrombin Time: 13.4 seconds (ref 11.4–15.2)

## 2015-12-28 LAB — COMPREHENSIVE METABOLIC PANEL
ALBUMIN: 2.9 g/dL — AB (ref 3.5–5.0)
ALK PHOS: 59 U/L (ref 38–126)
ALT: 51 U/L (ref 17–63)
AST: 37 U/L (ref 15–41)
Anion gap: 18 — ABNORMAL HIGH (ref 5–15)
BUN: 37 mg/dL — AB (ref 6–20)
CALCIUM: 9 mg/dL (ref 8.9–10.3)
CO2: 18 mmol/L — AB (ref 22–32)
CREATININE: 1.06 mg/dL (ref 0.61–1.24)
Chloride: 92 mmol/L — ABNORMAL LOW (ref 101–111)
GFR calc Af Amer: >60 mL/min (ref >60)
GFR calc non Af Amer: >60 mL/min (ref >60)
GLUCOSE: 565 mg/dL — AB (ref 65–99)
Potassium: 4.7 mmol/L (ref 3.5–5.1)
SODIUM: 128 mmol/L — AB (ref 135–145)
Total Bilirubin: 0.7 mg/dL (ref 0.3–1.2)
Total Protein: 6.4 g/dL — ABNORMAL LOW (ref 6.5–8.1)

## 2015-12-28 LAB — GLUCOSE, CAPILLARY
GLUCOSE-CAPILLARY: 252 mg/dL — AB (ref 65–99)
GLUCOSE-CAPILLARY: 333 mg/dL — AB (ref 65–99)
Glucose-Capillary: 432 mg/dL — ABNORMAL HIGH (ref 65–99)
Glucose-Capillary: 331 mg/dL — ABNORMAL HIGH (ref 65–99)

## 2015-12-28 LAB — CBC
HCT: 48 % (ref 40.0–52.0)
HEMOGLOBIN: 16.1 g/dL (ref 13.0–18.0)
MCH: 31.8 pg (ref 26.0–34.0)
MCHC: 33.5 g/dL (ref 32.0–36.0)
MCV: 95.1 fL (ref 80.0–100.0)
Platelets: 125 10*3/uL — ABNORMAL LOW (ref 150–440)
RBC: 5.04 MIL/uL (ref 4.40–5.90)
RDW: 14 % (ref 11.5–14.5)
WBC: 15.7 10*3/uL — ABNORMAL HIGH (ref 3.8–10.6)

## 2015-12-28 MED ORDER — SODIUM CHLORIDE 0.9 % IV SOLN
INTRAVENOUS | Status: DC
  Administered 2015-12-28: 3.7 [IU]/h via INTRAVENOUS
  Administered 2015-12-29: 15.8 [IU]/h via INTRAVENOUS
  Administered 2015-12-29: 13.6 [IU]/h via INTRAVENOUS
  Filled 2015-12-28: qty 2.5

## 2015-12-28 MED ORDER — DILTIAZEM HCL 25 MG/5ML IV SOLN
20.0000 mg | Freq: Once | INTRAVENOUS | Status: AC
  Administered 2015-12-28: 20 mg via INTRAVENOUS

## 2015-12-28 MED ORDER — ADENOSINE 6 MG/2ML IV SOLN
INTRAVENOUS | Status: AC
  Administered 2015-12-28: 6 mg via INTRAVENOUS
  Filled 2015-12-28: qty 2

## 2015-12-28 MED ORDER — DILTIAZEM HCL 25 MG/5ML IV SOLN
INTRAVENOUS | Status: AC
  Administered 2015-12-28: 20 mg via INTRAVENOUS
  Filled 2015-12-28: qty 5

## 2015-12-28 MED ORDER — DILTIAZEM HCL 100 MG IV SOLR
5.0000 mg/h | Freq: Once | INTRAVENOUS | Status: AC
  Administered 2015-12-28: 5 mg/h via INTRAVENOUS
  Filled 2015-12-28 (×2): qty 100

## 2015-12-28 MED ORDER — ADENOSINE 12 MG/4ML IV SOLN
INTRAVENOUS | Status: AC
  Filled 2015-12-28: qty 4

## 2015-12-28 MED ORDER — ADENOSINE 6 MG/2ML IV SOLN
6.0000 mg | Freq: Once | INTRAVENOUS | Status: AC
  Administered 2015-12-28: 6 mg via INTRAVENOUS

## 2015-12-28 NOTE — ED Notes (Signed)
Glucostabalizer verified with Helene Kelp, Therapist, sports .

## 2015-12-28 NOTE — ED Notes (Signed)
Patient denies pain and is comfortably watching tv.

## 2015-12-28 NOTE — ED Notes (Addendum)
Pt. Heart rate remaining above 115 on Cardizem 15 mg maximum dose drip.

## 2015-12-28 NOTE — H&P (Signed)
[facility] NAME: David Archer    MR#:  [id number]  DATE OF BIRTH:  1950-07-17  DATE OF ADMISSION:  12/28/2015  PRIMARY CARE PHYSICIAN: Sherrin Daisy, MD   REQUESTING/REFERRING PHYSICIAN: Harvest Dark, MD  CHIEF COMPLAINT:   Tachycardia  HISTORY OF PRESENT ILLNESS:  David Archer  is a 65 y.o. male with a known history of Frontal lobe tumor status post resection at [facility] hospital on 8/16 diagnosed with small cell carcinoma,  Diabetes , Paroxysmal atrial fibrillation only sent over to the emergency department by his home health nurse as his heart rate was at 170. Patient denies any chest pain or palpitations. Heart rate was better after receiving Cardizem 20 mg IV but again, patient became tachycardic and he was started on Cardizem drip. Blood sugar was at 565 and anion gap was at 18. Patient was given fluid boluses and insulin drip was started by the ED physician. Initial troponin 0.07 but patient denies any chest pain or dizziness. No other complaints resting comfortably during my examination  PAST MEDICAL HISTORY:   Past Medical History:  Diagnosis Date  . Arthritis   . Brain tumor ([place]) 8-9/17   unsure if cancerous at this time  . Diabetes mellitus without complication (HCC)    borderline  . Dysrhythmia    tachycardia  . GERD (gastroesophageal reflux disease)    takes OTC MEDS  . Headache   . Kidney stones    cant remember when  . Pre-diabetes   . Sleep apnea    back in 2000.  Hasn't been retested  . Tachycardia     PAST SURGICAL HISTOIRY:   Past Surgical History:  Procedure Laterality Date  . APPLICATION OF CRANIAL NAVIGATION N/A 08/17/2015   Procedure: APPLICATION OF CRANIAL NAVIGATION;  Surgeon: Newman Pies, MD;  Location: [place] NEURO ORS;  Service: Neurosurgery;  Laterality: N/A;  . APPLICATION OF CRANIAL NAVIGATION N/A 12/16/2015   Procedure: APPLICATION OF CRANIAL NAVIGATION;  Surgeon:  Newman Pies, MD;  Location: [place] NEURO ORS;  Service: Neurosurgery;  Laterality: N/A;  . COLONOSCOPY    . CRANIOTOMY Left 12/16/2015   Procedure: CRANIOTOMY LEFT FRONTAL;  Surgeon: Newman Pies, MD;  Location: [place] NEURO ORS;  Service: Neurosurgery;  Laterality: Left;  . HAS NEVER HAD     HAS NEVER HAD ANESTHESIA--NO SURGERIES  . PR DURAL GRAFT REPAIR,SPINE DEFECT Left 08/17/2015   Procedure: STEREOTACTIC BRAIN BIOPSY WITH Lucky Rathke NAVIGATION;  Surgeon: Newman Pies, MD;  Location: [place] NEURO ORS;  Service: Neurosurgery;  Laterality: Left;  Stereotactic brain biopsy with brainlab    SOCIAL HISTORY:   Social History  Substance Use Topics  . Smoking status: Former Smoker    Packs/day: 1.00    Years: 70.00    Types: Cigarettes    Quit date: 12/08/2008  . Smokeless tobacco: Former Systems developer    Quit date: 08/13/2008  . Alcohol use No     Comment: SWITCHES BETWEEN BEVERAGES-- GOES IN SPELLS    FAMILY HISTORY:  DM runs in family  DRUG ALLERGIES:  No Known Allergies  REVIEW OF SYSTEMS:  CONSTITUTIONAL: No fever, fatigue or weakness. Recent h/o frontal lobe tumor resection EYES: No blurred or double vision.  EARS, NOSE, AND THROAT: No tinnitus or ear pain.  RESPIRATORY: No cough, shortness of breath, wheezing or hemoptysis.  CARDIOVASCULAR: No chest pain, orthopnea, edema.  GASTROINTESTINAL: No nausea, vomiting, diarrhea or abdominal pain.  GENITOURINARY: No dysuria, hematuria.  ENDOCRINE: No polyuria, nocturia,  HEMATOLOGY: No anemia, easy bruising or bleeding SKIN: No rash or lesion. MUSCULOSKELETAL: No joint pain or arthritis.   NEUROLOGIC: No tingling, numbness, weakness.  PSYCHIATRY: No anxiety or depression.   MEDICATIONS AT HOME:   Prior to Admission medications   Medication Sig Start Date End Date Taking? Authorizing Provider  dexamethasone (DECADRON) 2 MG tablet Take 1 tablet (2 mg total) by mouth 2 (two) times daily. 12/19/15  Yes Earnie Larsson, MD  diltiazem (CARDIZEM CD)  360 MG 24 hr capsule Take 1 capsule (360 mg total) by mouth daily. 08/20/15  Yes Newman Pies, MD  fluticasone [facility]) 50 MCG/ACT nasal spray Place 2 sprays into both nostrils daily as needed for allergies or rhinitis.   Yes Historical Provider, MD  HYDROcodone-acetaminophen (NORCO/VICODIN) 5-325 MG tablet Take 1-2 tablets by mouth every 4 (four) hours as needed for moderate pain. 12/19/15  Yes Earnie Larsson, MD  metoprolol succinate (TOPROL-XL) 100 MG 24 hr tablet Take 1 tablet (100 mg total) by mouth daily. Take with or immediately following a meal. 08/20/15  Yes Newman Pies, MD  nortriptyline (PAMELOR) 10 MG capsule Take 10 mg by mouth at bedtime.    Yes Historical Provider, MD  rosuvastatin (CRESTOR) 10 MG tablet Take 10 mg by mouth daily.   Yes Historical Provider, MD      VITAL SIGNS:  Blood pressure 131/74, pulse 62, resp. rate (!) 24, height '5\' 11"'$  (1.803 m), weight 99.8 kg (220 lb), SpO2 96 %.  PHYSICAL EXAMINATION:  GENERAL:  65 y.o.-year-old patient lying in the bed with no acute distress.  EYES: Pupils equal, round, reactive to light and accommodation. No scleral icterus. Extraocular muscles intact.  HEENT: Head atraumatic, normocephalic. Oropharynx and nasopharynx clear.Dry mucous membranes; Craniotomy with approximately 40 staples and status post frontal lobe tumor resection on August 16 NECK:  Supple, no jugular venous distention. No thyroid enlargement, no tenderness.  LUNGS: Normal breath sounds bilaterally, no wheezing, rales,rhonchi or crepitation. No use of accessory muscles of respiration.  CARDIOVASCULAR: . No murmurs irregularly irregular, rubs, or gallops.  ABDOMEN: Soft, nontender, nondistended. Bowel sounds present. No organomegaly or mass.  EXTREMITIES: No pedal edema, cyanosis, or clubbing.  NEUROLOGIC: Cranial nerves II through XII are intact. Muscle strength 5/5 in all extremities. Sensation intact. Gait not checked.  PSYCHIATRIC: The patient is alert and  oriented x 3.  SKIN: No obvious rash, lesion, or ulcer.   LABORATORY PANEL:   CBC  Recent Labs Lab 12/28/15 1749  WBC 15.7*  HGB 16.1  HCT 48.0  PLT 125*   ------------------------------------------------------------------------------------------------------------------  Chemistries   Recent Labs Lab 12/28/15 1749  NA 128*  K 4.7  CL 92*  CO2 18*  GLUCOSE 565*  BUN 37*  CREATININE 1.06  CALCIUM 9.0  AST 37  ALT 51  ALKPHOS 59  BILITOT 0.7   ------------------------------------------------------------------------------------------------------------------  Cardiac Enzymes  Recent Labs Lab 12/28/15 1749  TROPONINI 0.07*   ------------------------------------------------------------------------------------------------------------------  RADIOLOGY:  No results found.  EKG:   Orders placed or performed during the hospital encounter of 12/28/15  . EKG 12-Lead  . EKG 12-Lead    IMPRESSION AND PLAN:   David Archer  is a 65 y.o. male with a known history of Frontal lobe tumor status post resection at [facility] hospital on 8/16 diagnosed with small cell carcinoma,  Diabetes , Paroxysmal atrial fibrillation only sent over to the emergency department by his home health nurse as his heart rate was at 170. Patient denies any chest pain or palpitations. Heart  rate was better after receiving Cardizem 20 mg IV but again, patient became tachycardic and he was started on Cardizem drip. Blood sugar was at 565 and anion gap was at 18.   #  A. fib with RVR Admit to intensive care unit. Cardizem drip , titrate as needed to decrease his heart rate 100   continue his home dose Cardizem   cardiac consult is placed to patient's primary cardiologist Dr. Not a candidate for anticoagulation at this time in view of recent history of frontal tumor resection on  16 August  #DKA-anion gap is 18 Continue insulin drip which was started in the emergency department   aggressive  hydration with IV fluids     when blood sugar is at 250 Will Change IV Fluids to D5 Half-Normal with 20 K  serial BMPs Check hemoglobin A1c Consult placed to diabetic coordinator  #Elevated troponin is probably from demand ischemia from A. fib with RVR We'll cycle troponins Monitor patient on telemetry currently patient is asymptomatic   #Hyponatremia-pseudo-secondary to hyperglycemia from DKA This will automatically get  corrected with insulin drip will monitor serial BMPs  #Recent history of craniotomy status post frontal lobe tumor resection diagnosed with small cell cancer Craniotomy site is healing well Outpatient follow-up with oncology as recommended  #Obstructive sleep apnea not on CPAP   DVT prophylaxis with SCDs All the records are reviewed and case discussed with ED provider. Management plans discussed with the patient, family and they are in agreement.  CODE STATUS: fc/ wife   TOTAL CRITICAL TIME TAKING CARE OF THIS PATIENT: 45 minutes.   Note: This dictation was prepared with Dragon dictation along with smaller phrase technology. Any transcriptional errors that result from this process are unintentional.  Nicholes Mango M.D on 12/28/2015 at 8:16 PM  Between 7am to 6pm - Pager - [phone]  After 6pm go to [email or web address] - password [email or web address] [facility]  [facility]  Office  [phone]  CC: Primary care physician; Sherrin Daisy, MD

## 2015-12-28 NOTE — ED Provider Notes (Signed)
[facility] Emergency Department Provider Note  Time seen: 6:25 PM  I have reviewed the triage vital signs and the nursing notes.   HISTORY  Chief Complaint Tachycardia    HPI David Archer is a 65 y.o. male with a past medical history of a brain tumor status post resection, diabetes, presents to the emergency department with elevated heart rate. According to the patient his home health nurse noticed that his heart rate was in the 170s so the patient was sent to the emergency department for evaluation. The patient denies any palpitations, denies feeling his heart beat fast, denies chest pain or shortness of breath. He is unsure how long his heart has been beating fast. Patient states a history of atrial fibrillation in the past, paroxysmal comes and goes. Is not currently on any blood thinners.  Past Medical History:  Diagnosis Date  . Arthritis   . Brain tumor ([place]) 8-9/17   unsure if cancerous at this time  . Diabetes mellitus without complication (HCC)    borderline  . Dysrhythmia    tachycardia  . GERD (gastroesophageal reflux disease)    takes OTC MEDS  . Headache   . Kidney stones    cant remember when  . Pre-diabetes   . Sleep apnea    back in 2000.  Hasn't been retested  . Tachycardia     Patient Active Problem List   Diagnosis Date Noted  . Atrial fibrillation ([place]) 08/18/2015  . Sleep apnea   . Diabetes mellitus without complication ([place])   . S/P craniotomy 08/17/2015  . Brain tumor ([place]) 08/17/2015    Past Surgical History:  Procedure Laterality Date  . APPLICATION OF CRANIAL NAVIGATION N/A 08/17/2015   Procedure: APPLICATION OF CRANIAL NAVIGATION;  Surgeon: Newman Pies, MD;  Location: [place] NEURO ORS;  Service: Neurosurgery;  Laterality: N/A;  . APPLICATION OF CRANIAL NAVIGATION N/A 12/16/2015   Procedure: APPLICATION OF CRANIAL NAVIGATION;  Surgeon: Newman Pies, MD;  Location: [place] NEURO ORS;  Service: Neurosurgery;   Laterality: N/A;  . COLONOSCOPY    . CRANIOTOMY Left 12/16/2015   Procedure: CRANIOTOMY LEFT FRONTAL;  Surgeon: Newman Pies, MD;  Location: [place] NEURO ORS;  Service: Neurosurgery;  Laterality: Left;  . HAS NEVER HAD     HAS NEVER HAD ANESTHESIA--NO SURGERIES  . PR DURAL GRAFT REPAIR,SPINE DEFECT Left 08/17/2015   Procedure: STEREOTACTIC BRAIN BIOPSY WITH Lucky Rathke NAVIGATION;  Surgeon: Newman Pies, MD;  Location: [place] NEURO ORS;  Service: Neurosurgery;  Laterality: Left;  Stereotactic brain biopsy with brainlab    Prior to Admission medications   Medication Sig Start Date End Date Taking? Authorizing Provider  dexamethasone (DECADRON) 2 MG tablet Take 1 tablet (2 mg total) by mouth 2 (two) times daily. 12/19/15   Earnie Larsson, MD  diltiazem (CARDIZEM CD) 360 MG 24 hr capsule Take 1 capsule (360 mg total) by mouth daily. 08/20/15   Newman Pies, MD  fluticasone [facility]) 50 MCG/ACT nasal spray Place 2 sprays into both nostrils daily as needed for allergies or rhinitis.    Historical Provider, MD  HYDROcodone-acetaminophen (NORCO/VICODIN) 5-325 MG tablet Take 1-2 tablets by mouth every 4 (four) hours as needed for moderate pain. 12/19/15   Earnie Larsson, MD  loratadine (CLARITIN) 10 MG tablet Take 10 mg by mouth daily as needed for allergies.    Historical Provider, MD  meloxicam (MOBIC) 15 MG tablet Take 15 mg by mouth daily.    Historical Provider, MD  metoprolol succinate (TOPROL-XL) 100 MG  24 hr tablet Take 1 tablet (100 mg total) by mouth daily. Take with or immediately following a meal. 08/20/15   Newman Pies, MD  nortriptyline (PAMELOR) 10 MG capsule Take 10 mg by mouth at bedtime.     Historical Provider, MD  rosuvastatin (CRESTOR) 10 MG tablet Take 10 mg by mouth daily.    Historical Provider, MD    Allergies  Allergen Reactions  . No Known Allergies     No family history on file.  Social History Social History  Substance Use Topics  . Smoking status: Former Smoker     Packs/day: 1.00    Years: 70.00    Types: Cigarettes    Quit date: 12/08/2008  . Smokeless tobacco: Former Systems developer    Quit date: 08/13/2008  . Alcohol use No     Comment: SWITCHES BETWEEN BEVERAGES-- GOES IN SPELLS    Review of Systems Constitutional: Negative for fever. Cardiovascular: Negative for chest pain. Respiratory: Negative for shortness of breath. Gastrointestinal: Negative for abdominal pain Genitourinary: Negative for dysuria. Musculoskeletal: Negative for back pain. Skin: Negative for rash. Neurological: Negative for headache 10-point ROS otherwise negative.  ____________________________________________   PHYSICAL EXAM:  VITAL SIGNS: ED Triage Vitals  Enc Vitals Group     BP 12/28/15 1737 (!) 114/96     Pulse Rate 12/28/15 1737 (!) 173     Resp 12/28/15 1737 18     Temp --      Temp src --      SpO2 --      Weight 12/28/15 1736 220 lb (99.8 kg)     Height 12/28/15 1736 '5\' 11"'$  (1.803 m)     Head Circumference --      Peak Flow --      Pain Score 12/28/15 1736 0     Pain Loc --      Pain Edu? --      Excl. in [place]? --     Constitutional: Alert and oriented. Well appearing and in no distress. Eyes: Normal exam ENT   Head: Normocephalic and atraumatic.   Mouth/Throat: Mucous membranes are moist. Cardiovascular:Regular rhythm and rate around 160-1 180 bpm. Respiratory: Normal respiratory effort without tachypnea nor retractions. Breath sounds are clear Gastrointestinal: Soft and nontender. No distention.   Musculoskeletal: Nontender with normal range of motion in all extremities.  Neurologic:  Normal speech and language. No gross focal neurologic deficits  Skin:  Skin is warm, dry and intact.  Psychiatric: Mood and affect are normal. Speech and behavior are normal.   ____________________________________________    EKG  EKG reviewed and interpreted by myself shows tachycardia 170 bpm, narrow QRS, normal axis, nonspecific ST changes, mild lateral ST  depressions. EKG consistent with SVT versus A. fib/flutter.  ____________________________________________    INITIAL IMPRESSION / ASSESSMENT AND PLAN / ED COURSE  Pertinent labs & imaging results that were available during my care of the patient were reviewed by me and considered in my medical decision making (see chart for details).  Patient presents the emergency department with a very fast heart rate around 170 bpm. Initially rhythm appeared to be most consistent with SVT. Modified Valsalva maneuver attempted without results. 6 mg of adenosine given which slowed the rate enough to reveal the underlying atrial flutter rhythm. Patient given 20 mg of diltiazem, rate currently, 130 bpm. Patient did recently undergo a brain surgery, the patient remains in atrial fibrillation he'll be admitted to the hospital for consideration of cardioversion versus blood thinner recent surgery  the patient may be a poor candidate for blood thinners.  Patient's labs have resulted with a mildly elevated troponin. Patient's blood glucose is elevated with an anion gap of 18. We will start the patient on insulin drip. Patient has received 220 mg boluses of diltiazem, he has responded each time but then quickly returns to an elevated heart rate. Now back to 170 bpm. We'll start on a diltiazem drip and admit to the hospital for further workup.   CRITICAL CARE Performed by: Harvest Dark   Total critical care time: 45 minutes  Critical care time was exclusive of separately billable procedures and treating other patients.  Critical care was necessary to treat or prevent imminent or life-threatening deterioration.  Critical care was time spent personally by me on the following activities: development of treatment plan with patient and/or surrogate as well as nursing, discussions with consultants, evaluation of patient's response to treatment, examination of patient, obtaining history from patient or surrogate,  ordering and performing treatments and interventions, ordering and review of laboratory studies, ordering and review of radiographic studies, pulse oximetry and re-evaluation of patient's condition.   ____________________________________________   FINAL CLINICAL IMPRESSION(S) / ED DIAGNOSES  Atypical fibrillation with rapid ventricular response    Harvest Dark, MD 12/28/15 1927

## 2015-12-28 NOTE — ED Notes (Signed)
Pharmacy contacted regarding pt. Medications.

## 2015-12-28 NOTE — ED Triage Notes (Signed)
"  My afib took off on me this afternoon"  Heart rate noticed by PT that patient's heartrate was elevated 150-170.  Sent to ED by PCP.

## 2015-12-29 DIAGNOSIS — I480 Paroxysmal atrial fibrillation: Secondary | ICD-10-CM | POA: Diagnosis present

## 2015-12-29 DIAGNOSIS — R000 Tachycardia, unspecified: Secondary | ICD-10-CM | POA: Diagnosis present

## 2015-12-29 DIAGNOSIS — M1990 Unspecified osteoarthritis, unspecified site: Secondary | ICD-10-CM | POA: Diagnosis present

## 2015-12-29 DIAGNOSIS — Z87891 Personal history of nicotine dependence: Secondary | ICD-10-CM | POA: Diagnosis not present

## 2015-12-29 DIAGNOSIS — C711 Malignant neoplasm of frontal lobe: Secondary | ICD-10-CM | POA: Diagnosis present

## 2015-12-29 DIAGNOSIS — Z7951 Long term (current) use of inhaled steroids: Secondary | ICD-10-CM | POA: Diagnosis not present

## 2015-12-29 DIAGNOSIS — Z833 Family history of diabetes mellitus: Secondary | ICD-10-CM | POA: Diagnosis not present

## 2015-12-29 DIAGNOSIS — I4891 Unspecified atrial fibrillation: Secondary | ICD-10-CM | POA: Diagnosis present

## 2015-12-29 DIAGNOSIS — Z79899 Other long term (current) drug therapy: Secondary | ICD-10-CM | POA: Diagnosis not present

## 2015-12-29 DIAGNOSIS — E1310 Other specified diabetes mellitus with ketoacidosis without coma: Secondary | ICD-10-CM | POA: Diagnosis present

## 2015-12-29 DIAGNOSIS — G4733 Obstructive sleep apnea (adult) (pediatric): Secondary | ICD-10-CM | POA: Diagnosis present

## 2015-12-29 DIAGNOSIS — I4892 Unspecified atrial flutter: Secondary | ICD-10-CM | POA: Diagnosis present

## 2015-12-29 DIAGNOSIS — Z87442 Personal history of urinary calculi: Secondary | ICD-10-CM | POA: Diagnosis not present

## 2015-12-29 DIAGNOSIS — E871 Hypo-osmolality and hyponatremia: Secondary | ICD-10-CM | POA: Diagnosis present

## 2015-12-29 DIAGNOSIS — K219 Gastro-esophageal reflux disease without esophagitis: Secondary | ICD-10-CM | POA: Diagnosis present

## 2015-12-29 DIAGNOSIS — Z9889 Other specified postprocedural states: Secondary | ICD-10-CM | POA: Diagnosis not present

## 2015-12-29 DIAGNOSIS — D696 Thrombocytopenia, unspecified: Secondary | ICD-10-CM | POA: Diagnosis present

## 2015-12-29 DIAGNOSIS — I248 Other forms of acute ischemic heart disease: Secondary | ICD-10-CM | POA: Diagnosis present

## 2015-12-29 LAB — CBC
HCT: 40.9 % (ref 40.0–52.0)
HEMOGLOBIN: 14.2 g/dL (ref 13.0–18.0)
MCH: 31.7 pg (ref 26.0–34.0)
MCHC: 34.7 g/dL (ref 32.0–36.0)
MCV: 91.5 fL (ref 80.0–100.0)
PLATELETS: 93 10*3/uL — AB (ref 150–440)
RBC: 4.47 MIL/uL (ref 4.40–5.90)
RDW: 13.9 % (ref 11.5–14.5)
WBC: 12.9 10*3/uL — ABNORMAL HIGH (ref 3.8–10.6)

## 2015-12-29 LAB — GLUCOSE, CAPILLARY
GLUCOSE-CAPILLARY: 249 mg/dL — AB (ref 65–99)
GLUCOSE-CAPILLARY: 286 mg/dL — AB (ref 65–99)
GLUCOSE-CAPILLARY: 154 mg/dL — AB (ref 65–99)
Glucose-Capillary: 121 mg/dL — ABNORMAL HIGH (ref 65–99)
Glucose-Capillary: 185 mg/dL — ABNORMAL HIGH (ref 65–99)
Glucose-Capillary: 200 mg/dL — ABNORMAL HIGH (ref 65–99)
Glucose-Capillary: 200 mg/dL — ABNORMAL HIGH (ref 65–99)
Glucose-Capillary: 220 mg/dL — ABNORMAL HIGH (ref 65–99)
Glucose-Capillary: 237 mg/dL — ABNORMAL HIGH (ref 65–99)
Glucose-Capillary: 243 mg/dL — ABNORMAL HIGH (ref 65–99)
Glucose-Capillary: 257 mg/dL — ABNORMAL HIGH (ref 65–99)
Glucose-Capillary: 265 mg/dL — ABNORMAL HIGH (ref 65–99)
Glucose-Capillary: 284 mg/dL — ABNORMAL HIGH (ref 65–99)

## 2015-12-29 LAB — TROPONIN I
TROPONIN I: 0.05 ng/mL — AB (ref <0.03)
Troponin I: 0.04 ng/mL (ref <0.03)
Troponin I: 0.04 ng/mL (ref <0.03)

## 2015-12-29 LAB — BASIC METABOLIC PANEL
ANION GAP: 9 (ref 5–15)
ANION GAP: 9 (ref 5–15)
Anion gap: 4 — ABNORMAL LOW (ref 5–15)
BUN: 31 mg/dL — ABNORMAL HIGH (ref 6–20)
BUN: 26 mg/dL — ABNORMAL HIGH (ref 6–20)
BUN: 24 mg/dL — ABNORMAL HIGH (ref 6–20)
CALCIUM: 8.1 mg/dL — AB (ref 8.9–10.3)
CALCIUM: 7.8 mg/dL — AB (ref 8.9–10.3)
CHLORIDE: 102 mmol/L (ref 101–111)
CO2: 22 mmol/L (ref 22–32)
CO2: 22 mmol/L (ref 22–32)
CO2: 27 mmol/L (ref 22–32)
CREATININE: 0.52 mg/dL — AB (ref 0.61–1.24)
Calcium: 7.9 mg/dL — ABNORMAL LOW (ref 8.9–10.3)
Chloride: 101 mmol/L (ref 101–111)
Chloride: 100 mmol/L — ABNORMAL LOW (ref 101–111)
Creatinine, Ser: 0.64 mg/dL (ref 0.61–1.24)
Creatinine, Ser: 0.51 mg/dL — ABNORMAL LOW (ref 0.61–1.24)
GFR calc Af Amer: >60 mL/min (ref >60)
GFR calc non Af Amer: >60 mL/min (ref >60)
GFR calc non Af Amer: >60 mL/min (ref >60)
GFR calc non Af Amer: >60 mL/min (ref >60)
GLUCOSE: 252 mg/dL — AB (ref 65–99)
Glucose, Bld: 261 mg/dL — ABNORMAL HIGH (ref 65–99)
Glucose, Bld: 188 mg/dL — ABNORMAL HIGH (ref 65–99)
POTASSIUM: 3.7 mmol/L (ref 3.5–5.1)
Potassium: 3.9 mmol/L (ref 3.5–5.1)
Potassium: 3.6 mmol/L (ref 3.5–5.1)
Sodium: 133 mmol/L — ABNORMAL LOW (ref 135–145)
Sodium: 132 mmol/L — ABNORMAL LOW (ref 135–145)
Sodium: 131 mmol/L — ABNORMAL LOW (ref 135–145)

## 2015-12-29 LAB — HEMOGLOBIN A1C: Hgb A1c MFr Bld: 9.2 % — ABNORMAL HIGH (ref 4.0–6.0)

## 2015-12-29 MED ORDER — ONDANSETRON HCL 4 MG PO TABS: 4.0000 mg | ORAL_TABLET | Freq: Four times a day (QID) | ORAL | Status: DC | PRN

## 2015-12-29 MED ORDER — DILTIAZEM HCL 100 MG IV SOLR
5.0000 mg/h | INTRAVENOUS | Status: DC
  Administered 2015-12-29: 12.5 mg/h via INTRAVENOUS
  Administered 2015-12-29: 15 mg/h via INTRAVENOUS
  Filled 2015-12-29 (×3): qty 100

## 2015-12-29 MED ORDER — PANTOPRAZOLE SODIUM 40 MG PO TBEC
40.0000 mg | DELAYED_RELEASE_TABLET | Freq: Every day | ORAL | Status: DC
  Administered 2015-12-29 – 2015-12-31 (×3): 40 mg via ORAL
  Filled 2015-12-29 (×3): qty 1

## 2015-12-29 MED ORDER — INSULIN ASPART 100 UNIT/ML ~~LOC~~ SOLN
0.0000 [IU] | Freq: Three times a day (TID) | SUBCUTANEOUS | Status: DC
  Administered 2015-12-29: 1 [IU] via SUBCUTANEOUS
  Administered 2015-12-29 – 2015-12-31 (×3): 2 [IU] via SUBCUTANEOUS
  Filled 2015-12-29 (×3): qty 2
  Filled 2015-12-29: qty 1

## 2015-12-29 MED ORDER — ONDANSETRON HCL 4 MG/2ML IJ SOLN: 4.0000 mg | Freq: Four times a day (QID) | INTRAMUSCULAR | Status: DC | PRN

## 2015-12-29 MED ORDER — DEXTROSE-NACL 5-0.45 % IV SOLN
INTRAVENOUS | Status: DC
  Administered 2015-12-29: 02:00:00 via INTRAVENOUS

## 2015-12-29 MED ORDER — SODIUM CHLORIDE 0.9 % IV SOLN: INTRAVENOUS | Status: AC

## 2015-12-29 MED ORDER — DILTIAZEM HCL 30 MG PO TABS
90.0000 mg | ORAL_TABLET | Freq: Three times a day (TID) | ORAL | Status: DC
  Administered 2015-12-29 (×2): 90 mg via ORAL
  Filled 2015-12-29 (×3): qty 3

## 2015-12-29 MED ORDER — DOCUSATE SODIUM 100 MG PO CAPS
100.0000 mg | ORAL_CAPSULE | Freq: Two times a day (BID) | ORAL | Status: DC
  Administered 2015-12-29 – 2015-12-31 (×4): 100 mg via ORAL
  Filled 2015-12-29 (×5): qty 1

## 2015-12-29 MED ORDER — GLUCERNA SHAKE PO LIQD
237.0000 mL | Freq: Two times a day (BID) | ORAL | Status: DC
  Administered 2015-12-29 – 2015-12-31 (×2): 237 mL via ORAL

## 2015-12-29 MED ORDER — DILTIAZEM HCL 30 MG PO TABS: 90.0000 mg | ORAL_TABLET | Freq: Three times a day (TID) | ORAL | Status: DC

## 2015-12-29 MED ORDER — LIVING WELL WITH DIABETES BOOK
Freq: Once | Status: AC
  Administered 2015-12-29: 17:00:00
  Filled 2015-12-29: qty 1

## 2015-12-29 MED ORDER — POTASSIUM CHLORIDE 10 MEQ/100ML IV SOLN
10.0000 meq | INTRAVENOUS | Status: AC
  Administered 2015-12-29 (×2): 10 meq via INTRAVENOUS
  Filled 2015-12-29 (×2): qty 100

## 2015-12-29 MED ORDER — HEPARIN SODIUM (PORCINE) 5000 UNIT/ML IJ SOLN
5000.0000 [IU] | Freq: Three times a day (TID) | INTRAMUSCULAR | Status: DC
  Administered 2015-12-29 – 2015-12-31 (×5): 5000 [IU] via SUBCUTANEOUS
  Filled 2015-12-29 (×5): qty 1

## 2015-12-29 MED ORDER — DILTIAZEM HCL 100 MG IV SOLR
5.0000 mg/h | INTRAVENOUS | Status: DC
  Administered 2015-12-29: 15 mg/h via INTRAVENOUS
  Filled 2015-12-29: qty 100

## 2015-12-29 MED ORDER — INSULIN GLARGINE 100 UNIT/ML ~~LOC~~ SOLN
20.0000 [IU] | Freq: Every day | SUBCUTANEOUS | Status: DC
  Administered 2015-12-29: 20 [IU] via SUBCUTANEOUS
  Filled 2015-12-29 (×2): qty 0.2

## 2015-12-29 MED ORDER — METOPROLOL SUCCINATE ER 50 MG PO TB24
100.0000 mg | ORAL_TABLET | Freq: Every day | ORAL | Status: DC
  Administered 2015-12-29: 100 mg via ORAL
  Filled 2015-12-29: qty 2

## 2015-12-29 MED ORDER — SODIUM CHLORIDE 0.9 % IV SOLN: INTRAVENOUS | Status: DC

## 2015-12-29 MED ORDER — ACETAMINOPHEN 325 MG PO TABS
650.0000 mg | ORAL_TABLET | Freq: Four times a day (QID) | ORAL | Status: DC | PRN
  Administered 2015-12-29: 650 mg via ORAL
  Filled 2015-12-29: qty 2

## 2015-12-29 MED ORDER — AMIODARONE HCL 200 MG PO TABS
400.0000 mg | ORAL_TABLET | Freq: Two times a day (BID) | ORAL | Status: DC
  Administered 2015-12-29 – 2015-12-31 (×5): 400 mg via ORAL
  Filled 2015-12-29 (×5): qty 2

## 2015-12-29 MED ORDER — ACETAMINOPHEN 650 MG RE SUPP: 650.0000 mg | Freq: Four times a day (QID) | RECTAL | Status: DC | PRN

## 2015-12-29 NOTE — Progress Notes (Signed)
Inpatient Diabetes Program Recommendations  AACE/ADA: New Consensus Statement on Inpatient Glycemic Control (2015)  Target Ranges:  Prepandial:   less than 140 mg/dL      Peak postprandial:   less than 180 mg/dL (1-2 hours)      Critically ill patients:  140 - 180 mg/dL    Spoke with patient and wife today.  Discussed with wife and patient that steroids often can lead patients to have issues with their glucose levels. Wife told me that patient was taking Decadron tid at home prior to brain surgery and then decreased Decadron to bid at home after the tumor resection surgery.  Pt was supposed to see Neurosurgeon today for follow-up and the wife was going to ask about what to do with the Decadron but patient wound up in ED with Cardiac issues and DKA.  Spoke with patient and wife about what DKA is, treatment, labs, etc.  Explained that we have transitioned patient off IV Insulin to SQ insulin injections.  Patient and wife told me that patient's doctors have told him that he has "borderline" diabetes but that he has never taken any medicine to control his CBGs.  Does not have CBG meter at home.  Discussed with patient and wife that patient has another A1c pending.  Pt's wife knew what the A1c was and stated the last one was around 7%.  Explained to pt and wife that we will see what the next A1c results are and that patient may require some sort of medication (either oral or insulin) to help control CBGs while he is getting Decadron at home.  Patient and wife both agreeable to this.    Will have RNs caring for pt begin insulin teaching and CBG meter teaching in case decision made to send pt home on insulin.  Will also order Living Well with DM booklet for patient.  Encouraged patient's wife to reschedule appt for pt with his Neurosurgeon and to call Neurosurgeon's office to ask about what to do with patient's Decadron.     --Will follow patient during hospitalization--  Wyn Quaker RN,  MSN, CDE Diabetes Coordinator Inpatient Glycemic Control Team Team Pager: [phone] (8a-5p)

## 2015-12-29 NOTE — Progress Notes (Signed)
RN spoke with Dr. Saralyn Pilar and made him aware that patient's cardizem drip was stopped at 1700 and at that time patient had converted to NSR but that at 1740 patient flipped back in aflutter rate as high as 187 currently.  Cardizem drip started back at 1741 and patient continues in aflutter.  MD acknowledged and stated "it's probably going to take a while for the amio to kick in so just keep him on the drip and keep him in the unit."

## 2015-12-29 NOTE — Consult Note (Signed)
[facility] Cardiology  CARDIOLOGY CONSULT NOTE  [name] MRN: [id number] DOB/AGE: [birth year] 65 y.o.  Admit date: 12/28/2015 Referring Physician [facility] Primary Physician [facility] Primary Cardiologist Fath Reason for Consultation Atrial flutter  HPI: 65 year old gentleman referred for evaluation for atrial flutter. 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 Patient was seen by his home health nurse yesterday and was noted to be tachycardic with a regular 170. The patient was brought to [facility] emergency room where he was noted be in atrial flutter with a rapid ventricular rate. She was treated with Cardizem bolus and drip with better control with ventricular rate of 90 bpm. Admission labs were also notable for elevated glucose of 565. Troponin was borderline elevated with  at 0.05.  Review of systems complete and found to be negative unless listed above     Past Medical History:  Diagnosis Date  . Arthritis   . Brain tumor ([place]) 8-9/17   unsure if cancerous at this time  . Diabetes mellitus without complication (HCC)    borderline  . Dysrhythmia    tachycardia  . GERD (gastroesophageal reflux disease)    takes OTC MEDS  . Headache   . Kidney stones    cant remember when  . Pre-diabetes   . Sleep apnea    back in 2000.  Hasn't been retested  . Tachycardia     Past Surgical History:  Procedure Laterality Date  . APPLICATION OF CRANIAL NAVIGATION N/A 08/17/2015   Procedure: APPLICATION OF CRANIAL NAVIGATION;  Surgeon: Newman Pies, MD;  Location: [place] NEURO ORS;  Service: Neurosurgery;  Laterality: N/A;  . APPLICATION OF CRANIAL NAVIGATION N/A 12/16/2015   Procedure: APPLICATION OF CRANIAL NAVIGATION;  Surgeon: Newman Pies, MD;  Location: [place] NEURO ORS;  Service: Neurosurgery;  Laterality: N/A;  . COLONOSCOPY    . CRANIOTOMY Left 12/16/2015   Procedure: CRANIOTOMY LEFT FRONTAL;   Surgeon: Newman Pies, MD;  Location: [place] NEURO ORS;  Service: Neurosurgery;  Laterality: Left;  . HAS NEVER HAD     HAS NEVER HAD ANESTHESIA--NO SURGERIES  . PR DURAL GRAFT REPAIR,SPINE DEFECT Left 08/17/2015   Procedure: STEREOTACTIC BRAIN BIOPSY WITH Lucky Rathke NAVIGATION;  Surgeon: Newman Pies, MD;  Location: [place] NEURO ORS;  Service: Neurosurgery;  Laterality: Left;  Stereotactic brain biopsy with brainlab    Prescriptions Prior to Admission  Medication Sig Dispense Refill Last Dose  . dexamethasone (DECADRON) 2 MG tablet Take 1 tablet (2 mg total) by mouth 2 (two) times daily. 60 tablet 1 12/28/2015 at 0630  . diltiazem (CARDIZEM CD) 360 MG 24 hr capsule Take 1 capsule (360 mg total) by mouth daily. 30 capsule 1 12/27/2015 at Unknown time  . fluticasone (FLONASE) 50 MCG/ACT nasal spray Place 2 sprays into both nostrils daily as needed for allergies or rhinitis.   12/28/2015 at Unknown time  . HYDROcodone-acetaminophen (NORCO/VICODIN) 5-325 MG tablet Take 1-2 tablets by mouth every 4 (four) hours as needed for moderate pain. 60 tablet 0 Past Month at Unknown time  . metoprolol succinate (TOPROL-XL) 100 MG 24 hr tablet Take 1 tablet (100 mg total) by mouth daily. Take with or immediately following a meal. 30 tablet 1 12/27/2015 at Unknown time  . nortriptyline (PAMELOR) 10 MG capsule Take 10 mg by mouth at bedtime.    12/27/2015 at Unknown time  . rosuvastatin (CRESTOR) 10 MG tablet Take 10 mg by mouth daily.   12/27/2015 at Unknown time  Social History   Social History  . Marital status: Married    Spouse name: N/A  . Number of children: N/A  . Years of education: N/A   Occupational History  . Not on file.   Social History Main Topics  . Smoking status: Former Smoker    Packs/day: 1.00    Years: 70.00    Types: Cigarettes    Quit date: 12/08/2008  . Smokeless tobacco: Former Systems developer    Quit date: 08/13/2008  . Alcohol use No     Comment: SWITCHES BETWEEN BEVERAGES-- GOES IN SPELLS  .  Drug use: No  . Sexual activity: Not on file   Other Topics Concern  . Not on file   Social History Narrative  . No narrative on file    No family history on file.    Review of systems complete and found to be negative unless listed above      PHYSICAL EXAM  General: Well developed, well nourished, in no acute distress HEENT:  Normocephalic and atramatic Neck:  No JVD.  Lungs: Clear bilaterally to auscultation and percussion. Heart: HRRR . Normal S1 and S2 without gallops or murmurs.  Abdomen: Bowel sounds are positive, abdomen soft and non-tender  Msk:  Back normal, normal gait. Normal strength and tone for age. Extremities: No clubbing, cyanosis or edema.   Neuro: Alert and oriented X 3. Psych:  Good affect, responds appropriately  Labs:   Lab Results  Component Value Date   WBC 12.9 (H) 12/29/2015   HGB 14.2 12/29/2015   HCT 40.9 12/29/2015   MCV 91.5 12/29/2015   PLT 93 (L) 12/29/2015    Recent Labs Lab 12/28/15 1749  12/29/15 0633  NA 128*  < > 132*  K 4.7  < > 3.9  CL 92*  < > 101  CO2 18*  < > 22  BUN 37*  < > 26*  CREATININE 1.06  < > 0.52*  CALCIUM 9.0  < > 7.8*  PROT 6.4*  --   --   BILITOT 0.7  --   --   ALKPHOS 59  --   --   ALT 51  --   --   AST 37  --   --   GLUCOSE 565*  < > 261*  < > = values in this interval not displayed. Lab Results  Component Value Date   TROPONINI 0.05 ([place]) 12/29/2015   No results found for: CHOL No results found for: HDL No results found for: LDLCALC No results found for: TRIG No results found for: CHOLHDL No results found for: LDLDIRECT    Radiology: Ct Head Wo Contrast  Result Date: 12/16/2015 CLINICAL DATA:  Craniotomy today.  aphasia. EXAM: CT HEAD WITHOUT CONTRAST TECHNIQUE: Contiguous axial images were obtained from the base of the skull through the vertex without intravenous contrast. COMPARISON:  MRI 12/09/2015 FINDINGS: Interval left frontal craniotomy. Partial resection of a large cystic mass in the  left frontal lobe. There is some hemorrhage within the post resection space as would be expected. The area of hemorrhage measures approximately 3.5 cm. Surrounding brain shows low density consistent with edema. I do not see evidence of infarction outside of the operative field or region of edema. There is diminished mass effect with right-to-left shift of only 3 mm presently. No hydrocephalus or intraventricular hemorrhage. No extra-axial collection. IMPRESSION: Good appearance following debulking of a left frontal tumor. Less mass effect with left-to-right shift 3 mm. Blood products in the surgical  bed as expected. Low-density in the region consistent with residual edema. No finding to suggest infarction outside of the surgical field. Electronically Signed   By: Nelson Chimes M.D.   On: 12/16/2015 19:06   [name] Contrast  Result Date: 12/18/2015 CLINICAL DATA:  Continued surveillance following LEFT frontal resection. EXAM: MRI HEAD WITHOUT CONTRAST TECHNIQUE: Multiplanar, multiecho pulse sequences of the brain and surrounding structures were obtained without intravenous contrast. COMPARISON:  Multiple priors, most recent CT 12/16/2015. Most recent MR 12/09/2015. FINDINGS: According to the technologist, the patient became unable to cooperate for completion of the exam. Contrast was not administered. When the patient becomes more stable, post infusion imaging could be performed as a separate exam. Today's examination is reported as a MR brain without. LEFT frontal resection cavity is redemonstrated. Restricted diffusion surrounding the cavity, most notable medially (image 20 series 4) represents perioperative ischemia. Moderate residual vasogenic edema. Left-to-right shift is measured at septum pellucidum level on image 16 is 3 mm. Acute and subacute blood products within the resection cavity are an expected postoperative finding. No significant extra-axial fluid. Baseline atrophy without significant chronic  microvascular ischemic change. Unremarkable pituitary and cerebellar tonsils. Chronic LEFT maxillary sinus disease. Negative orbits. IMPRESSION: Only noncontrast images could be obtained. Baseline examination for postcontrast enhancement/assessment or residual tumor was not possible because the patient was unable to cooperate. Post infusion imaging could be performed at a later date if the patient is able to remain motionless. Perioperative ischemia, most notable in the medial frontal lobe. Moderate residual vasogenic edema with only slight left-to-right shift. Electronically Signed   By: Staci Righter M.D.   On: 12/18/2015 13:52   Mr Jeri Cos JY Contrast  Result Date: 12/09/2015 CLINICAL DATA:  Left frontal brain tumor. Gliosis without definite neoplasm on biopsy 08/17/2015 EXAM: MRI HEAD WITHOUT AND WITH CONTRAST TECHNIQUE: Multiplanar, multiecho pulse sequences of the brain and surrounding structures were obtained without and with intravenous contrast. CONTRAST:  58m MULTIHANCE GADOBENATE DIMEGLUMINE 529 MG/ML IV SOLN COMPARISON:  MRI brain 11/24/2015. FINDINGS: A peripherally enhancing mass is again noted in the anterior left frontal lobe. The dimensions are slightly smaller than on the most recent exam. This is often seen in the setting of steroid treatment. Subfalcine herniation measures 5 mm, not significantly changed. Diffuse left frontal lobe edema is similar to the prior studies. No new areas of enhancement are present. No remote areas of increased T2 signal are present. The internal auditory canals are within normal limits. Flow is present in the major intracranial arteries. The globes and orbits are intact. Chronic left maxillary sinus disease is again seen. The remaining paranasal sinuses and the mastoid air cells are clear. The skullbase is within normal limits. Midline sagittal structures are otherwise unremarkable. IMPRESSION: 1. Peripherally enhancing left frontal lobe mass lesion is again seen.  The overall size of lesion is smaller. The thickness of enhancement is increased. The areas of restricted diffusion are unchanged. 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 Electronically Signed   By: CSan MorelleM.D.   On: 12/09/2015 16:21    EKG: Atrial flutter with 31 conduction at a rate of 90 bpm  ASSESSMENT AND PLAN:   1. Atrial flutter with rapid ventricular rate, controlled on Cardizem drip, currently appears asymptomatic 2. Borderline elevated troponin, likely due to demand supply ischemia 3. Marked hyperglycemia  Recommendations  1. Agree with overall current therapy 2. Start amiodarone 400 mg twice a day for rate, and possible rhythm control  3. Consider chronic anticoagulation, after evaluation for bleeding risk following brain surgery  Signed: Ashanti Ratti MD,PhD, [facility] 12/29/2015, 8:47 AM

## 2015-12-29 NOTE — Progress Notes (Signed)
RN spoke with Dr. Sharlet Salina nurse because he is in surgery. RN asked nurse to make MD aware that patient's IV access went bad and when it did patient's heart rate went up to 180 aflutter and is remaining elevated and patient has had oral doses of cardizem and amio.  RN to relay message to MD.

## 2015-12-29 NOTE — Progress Notes (Signed)
[place] at [facility]                                                                                                                                                                                            Patient Demographics   David Archer, is a 65 y.o. male, DOB - 08/13/1950, GBT:[id number]  Admit date - 12/28/2015   Admitting Physician Nicholes Mango, MD  Outpatient Primary MD for the patient is Sherrin Daisy, MD   LOS - 0  Subjective: Patient admitted with A. fib with RVR. Heart rate is improved. He is currently denying any chest pain. Patient also was noted to have DKA and currently on insulin drip. Patient was recently had a brain surgery and was started on Decadron. He otherwise feels well denies any complaints    Review of Systems:   CONSTITUTIONAL: No documented fever. No fatigue, weakness. No weight gain, no weight loss.  EYES: No blurry or double vision.  ENT: No tinnitus. No postnasal drip. No redness of the oropharynx.  RESPIRATORY: No cough, no wheeze, no hemoptysis. No dyspnea.  CARDIOVASCULAR: No chest pain. No orthopnea. No palpitations. No syncope.  GASTROINTESTINAL: No nausea, no vomiting or diarrhea. No abdominal pain. No melena or hematochezia.  GENITOURINARY: No dysuria or hematuria.  ENDOCRINE: No polyuria or nocturia. No heat or cold intolerance.  HEMATOLOGY: No anemia. No bruising. No bleeding.  INTEGUMENTARY: No rashes. No lesions.  MUSCULOSKELETAL: No arthritis. No swelling. No gout.  NEUROLOGIC: No numbness, tingling, or ataxia. No seizure-type activity.  PSYCHIATRIC: No anxiety. No insomnia. No ADD.    Vitals:   Vitals:   12/29/15 1100 12/29/15 1200 12/29/15 1217 12/29/15 1220  BP: 112/72 103/76 103/76   Pulse: 67 67    Resp: 16 17    Temp:    97.7 F (36.5 C)  TempSrc:    Oral  SpO2: 96% 96%    Weight:      Height:        Wt Readings from Last 3 Encounters:  12/28/15 99.8 kg (220 lb)  12/16/15  108.9 kg (240 lb)  08/17/15 107.4 kg (236 lb 12.4 oz)     Intake/Output Summary (Last 24 hours) at 12/29/15 1319 Last data filed at 12/29/15 1214  Gross per 24 hour  Intake            882.5 ml  Output              800 ml  Net             82.5 ml    Physical Exam:   GENERAL:  Pleasant-appearing in no apparent distress.  HEAD, EYES, EARS, NOSE AND THROAT: Atraumatic, normocephalic. Extraocular muscles are intact. Pupils equal and reactive to light. Sclerae anicteric. No conjunctival injection. No oro-pharyngeal erythema.  NECK: Supple. There is no jugular venous distention. No bruits, no lymphadenopathy, no thyromegaly.  HEART: Irregularly irregular No murmurs, no rubs, no clicks.  LUNGS: Clear to auscultation bilaterally. No rales or rhonchi. No wheezes.  ABDOMEN: Soft, flat, nontender, nondistended. Has good bowel sounds. No hepatosplenomegaly appreciated.  EXTREMITIES: No evidence of any cyanosis, clubbing, or peripheral edema.  +2 pedal and radial pulses bilaterally.  NEUROLOGIC: The patient is alert, awake, and oriented x3 with no focal motor or sensory deficits appreciated bilaterally.  SKIN: Moist and warm with no rashes appreciated.  Psych: Not anxious, depressed LN: No inguinal LN enlargement    Antibiotics   Anti-infectives    None      Medications   Scheduled Meds: . amiodarone  400 mg Oral BID  . diltiazem  90 mg Oral Q8H  . docusate sodium  100 mg Oral BID  . feeding supplement (GLUCERNA SHAKE)  237 mL Oral BID BM  . insulin aspart  0-9 Units Subcutaneous TID WC  . insulin glargine  20 Units Subcutaneous Daily  . pantoprazole  40 mg Oral QAC breakfast   Continuous Infusions: . sodium chloride    . sodium chloride    . diltiazem (CARDIZEM) infusion    . insulin (NOVOLIN-R) infusion Stopped (12/29/15 1214)   PRN Meds:.acetaminophen **OR** acetaminophen, ondansetron **OR** ondansetron (ZOFRAN) IV   Data Review:   Micro Results No results found for this  or any previous visit (from the past 240 hour(s)).  Radiology Reports Ct Head Wo Contrast  Result Date: 12/16/2015 CLINICAL DATA:  Craniotomy today.  aphasia. EXAM: CT HEAD WITHOUT CONTRAST TECHNIQUE: Contiguous axial images were obtained from the base of the skull through the vertex without intravenous contrast. COMPARISON:  MRI 12/09/2015 FINDINGS: Interval left frontal craniotomy. Partial resection of a large cystic mass in the left frontal lobe. There is some hemorrhage within the post resection space as would be expected. The area of hemorrhage measures approximately 3.5 cm. Surrounding brain shows low density consistent with edema. I do not see evidence of infarction outside of the operative field or region of edema. There is diminished mass effect with right-to-left shift of only 3 mm presently. No hydrocephalus or intraventricular hemorrhage. No extra-axial collection. IMPRESSION: Good appearance following debulking of a left frontal tumor. Less mass effect with left-to-right shift 3 mm. Blood products in the surgical bed as expected. Low-density in the region consistent with residual edema. No finding to suggest infarction outside of the surgical field. Electronically Signed   By: Nelson Chimes M.D.   On: 12/16/2015 19:06   [name] Contrast  Result Date: 12/18/2015 CLINICAL DATA:  Continued surveillance following LEFT frontal resection. EXAM: MRI HEAD WITHOUT CONTRAST TECHNIQUE: Multiplanar, multiecho pulse sequences of the brain and surrounding structures were obtained without intravenous contrast. COMPARISON:  Multiple priors, most recent CT 12/16/2015. Most recent MR 12/09/2015. FINDINGS: According to the technologist, the patient became unable to cooperate for completion of the exam. Contrast was not administered. When the patient becomes more stable, post infusion imaging could be performed as a separate exam. Today's examination is reported as a MR brain without. LEFT frontal resection  cavity is redemonstrated. Restricted diffusion surrounding the cavity, most notable medially (image 20 series 4) represents perioperative ischemia. Moderate residual vasogenic edema. Left-to-right shift is measured  at septum pellucidum level on image 16 is 3 mm. Acute and subacute blood products within the resection cavity are an expected postoperative finding. No significant extra-axial fluid. Baseline atrophy without significant chronic microvascular ischemic change. Unremarkable pituitary and cerebellar tonsils. Chronic LEFT maxillary sinus disease. Negative orbits. IMPRESSION: Only noncontrast images could be obtained. Baseline examination for postcontrast enhancement/assessment or residual tumor was not possible because the patient was unable to cooperate. Post infusion imaging could be performed at a later date if the patient is able to remain motionless. Perioperative ischemia, most notable in the medial frontal lobe. Moderate residual vasogenic edema with only slight left-to-right shift. Electronically Signed   By: Staci Righter M.D.   On: 12/18/2015 13:52   Mr Jeri Cos AV Contrast  Result Date: 12/09/2015 CLINICAL DATA:  Left frontal brain tumor. Gliosis without definite neoplasm on biopsy 08/17/2015 EXAM: MRI HEAD WITHOUT AND WITH CONTRAST TECHNIQUE: Multiplanar, multiecho pulse sequences of the brain and surrounding structures were obtained without and with intravenous contrast. CONTRAST:  9m MULTIHANCE GADOBENATE DIMEGLUMINE 529 MG/ML IV SOLN COMPARISON:  MRI brain 11/24/2015. FINDINGS: A peripherally enhancing mass is again noted in the anterior left frontal lobe. The dimensions are slightly smaller than on the most recent exam. This is often seen in the setting of steroid treatment. Subfalcine herniation measures 5 mm, not significantly changed. Diffuse left frontal lobe edema is similar to the prior studies. No new areas of enhancement are present. No remote areas of increased T2 signal are  present. The internal auditory canals are within normal limits. Flow is present in the major intracranial arteries. The globes and orbits are intact. Chronic left maxillary sinus disease is again seen. The remaining paranasal sinuses and the mastoid air cells are clear. The skullbase is within normal limits. Midline sagittal structures are otherwise unremarkable. IMPRESSION: 1. Peripherally enhancing left frontal lobe mass lesion is again seen. The overall size of lesion is smaller. The thickness of enhancement is increased. The areas of restricted diffusion are unchanged. 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 Electronically Signed   By: CSan MorelleM.D.   On: 12/09/2015 16:21     CBC  Recent Labs Lab 12/28/15 1749 12/29/15 0633  WBC 15.7* 12.9*  HGB 16.1 14.2  HCT 48.0 40.9  PLT 125* 93*  MCV 95.1 91.5  MCH 31.8 31.7  MCHC 33.5 34.7  RDW 14.0 13.9    Chemistries   Recent Labs Lab 12/28/15 1749 12/29/15 0218 12/29/15 0633 12/29/15 1020  NA 128* 133* 132* 131*  K 4.7 3.7 3.9 3.6  CL 92* 102 101 100*  CO2 18* '22 22 27  '$ GLUCOSE 565* 252* 261* 188*  BUN 37* 31* 26* 24*  CREATININE 1.06 0.64 0.52* 0.51*  CALCIUM 9.0 8.1* 7.8* 7.9*  AST 37  --   --   --   ALT 51  --   --   --   ALKPHOS 59  --   --   --   BILITOT 0.7  --   --   --    ------------------------------------------------------------------------------------------------------------------ estimated creatinine clearance is 113.8 mL/min (by C-G formula based on SCr of 0.8 mg/dL). ------------------------------------------------------------------------------------------------------------------ No results for input(s): HGBA1C in the last 72 hours. ------------------------------------------------------------------------------------------------------------------ No results for input(s): CHOL, HDL, LDLCALC, TRIG, CHOLHDL, LDLDIRECT in the last 72  hours. ------------------------------------------------------------------------------------------------------------------ No results for input(s): TSH, T4TOTAL, T3FREE, THYROIDAB in the last 72 hours.  Invalid input(s): FREET3 ------------------------------------------------------------------------------------------------------------------ No results for input(s): VITAMINB12, FOLATE, FERRITIN, TIBC, IRON, RETICCTPCT  in the last 72 hours.  Coagulation profile  Recent Labs Lab 12/28/15 1749  INR 1.02    No results for input(s): DDIMER in the last 72 hours.  Cardiac Enzymes  Recent Labs Lab 12/28/15 1749 12/29/15 0218 12/29/15 0633  TROPONINI 0.07* 0.04* 0.05*   ------------------------------------------------------------------------------------------------------------------ Invalid input(s): POCBNP    Assessment & Plan   David Archer  is a 65 y.o. male with a known history of Frontal lobe tumor status post resection at [facility] on 8/16 diagnosed with small cell carcinoma,  Diabetes , Paroxysmal atrial fibrillation only sent over to the emergency department by his home health nurse as his heart rate was at 170. Patient denies any chest pain or palpitations. Heart rate was better after receiving Cardizem 20 mg IV but again, patient became tachycardic and he was started on Cardizem drip. Blood sugar was at 565 and anion gap was at 18.   #  A. fib with RVR Change to po cardizem he was on this before Due to brain surgery recently cant use of anticoagulation  #DKA-anion gap is 18 Start  On lantus 20 units Stop insulin Check hbga1c  No history of diabetic   #Elevated troponin is probably from demand ischemia from A. fib with RVR Due to demand ischemia  #Hyponatremia-pseudo-secondary to hyperglycemia from DKA   #Recent history of craniotomy status post frontal lobe tumor resection diagnosed with small cell cancer Craniotomy site is healing well Resume oral  decadron   #Obstructive sleep apnea not on CPAP   DVT prophylaxis with SCDs All the records are reviewed and case discussed with ED provider. Management plans discussed with the patient, family and they are in agreement.     Code Status Orders        Start     Ordered   12/29/15 0122  Full code  Continuous     12/29/15 0121    Code Status History    Date Active Date Inactive Code Status Order ID Comments User Context   12/16/2015  7:14 PM 12/19/2015  5:07 PM Full Code [id number]  Newman Pies, MD Inpatient           Consults  cardiology   DVT Prophylaxis scd's  Lab Results  Component Value Date   PLT 93 (L) 12/29/2015     Time Spent in minutes   42mn  Greater than 50% of time spent in care coordination and counseling patient regarding the condition and plan of care.   PDustin FlockM.D on 12/29/2015 at 1:19 PM  Between 7am to 6pm - Pager - [phone]  After 6pm go to [email or web address] - password [email or web address]resHospitalists   Office  [phone]

## 2015-12-29 NOTE — Progress Notes (Signed)
Patient converted to NSR at 1545.  Cardizem drip titrated down to '5mg'$ /H.

## 2015-12-29 NOTE — Progress Notes (Signed)
RN spoke with Dr. Saralyn Pilar and made MD aware that Dr. Posey Pronto had ordered to d/c cardiazem drip and to start 90 mg cardiazem PO q8H and to send patient to tele. Dr. Saralyn Pilar gave order to give the cardiazem now and wait 30 minutes and then begin to titrate it down and then off if patient's heart rate tolerates and then he can go to 2A.

## 2015-12-29 NOTE — Progress Notes (Addendum)
Inpatient Diabetes Program Recommendations  AACE/ADA: New Consensus Statement on Inpatient Glycemic Control (2015)  Target Ranges:  Prepandial:   less than 140 mg/dL      Peak postprandial:   less than 180 mg/dL (1-2 hours)      Critically ill patients:  140 - 180 mg/dL   Results for ETHRIDGE, SOLLENBERGER (MRN [id number]) as of 12/29/2015 10:01  Ref. Range 12/28/2015 17:49  Sodium Latest Ref Range: 135 - 145 mmol/L 128 (L)  Potassium Latest Ref Range: 3.5 - 5.1 mmol/L 4.7  Chloride Latest Ref Range: 101 - 111 mmol/L 92 (L)  CO2 Latest Ref Range: 22 - 32 mmol/L 18 (L)  BUN Latest Ref Range: 6 - 20 mg/dL 37 (H)  Creatinine Latest Ref Range: 0.61 - 1.24 mg/dL 1.06  Calcium Latest Ref Range: 8.9 - 10.3 mg/dL 9.0  EGFR (Non-African Amer.) Latest Ref Range: >60 mL/min >60  EGFR (African American) Latest Ref Range: >60 mL/min >60  Glucose Latest Ref Range: 65 - 99 mg/dL 565 (HH)  Anion gap Latest Ref Range: 5 - 15  18 (H)    Admit with: A Fib with RVR/ DKA  History: DM, Brain Tumor  Home DM Meds: None  Current Insulin Orders: IV Insulin drip     -Patient recently underwent tumor resection by Neurosurgeon at [facility] on 12/16/15.  Takes Decadron 2 mg bid at home as well.  -Question if the Decadron led to patient's Hyperglycemia/DKA episode this admission?  -Started on IV Insulin drip at 8:30pm last night.  -BMET shows marked improvement this AM: CO2 up to 22 and Anion Gap down to 9.  -Note plans to transition patient to Lantus and Novolog today.  Current A1c pending.    --Will follow patient during hospitalization--  Wyn Quaker RN, MSN, CDE Diabetes Coordinator Inpatient Glycemic Control Team Team Pager: [phone] (8a-5p)

## 2015-12-29 NOTE — Progress Notes (Signed)
When this RN returned from her lunch break at 1315, patient's heart rate in 150-180 aflutter.  Cardizem infusing in left AC peripheral IV.  RN flushed left AC peripheral IV which flushed well but does not have blood return.  RN then attempted to flush right wrist IV and IV catheter kinked therefore removed.  This RN attempt IV access unsuccessfully. Christina, RN just now inserted new IV to right wrist area therefore cardizem drip moved to new IV site.  Will continue to monitor heart rate.

## 2015-12-29 NOTE — Progress Notes (Signed)
[facility]  Patient Status: Active  [facility] is providing the following services: PT/OT/ST  If patient discharges after hours, please call [phone].   David Archer 12/29/2015, 10:35 AM

## 2015-12-29 NOTE — Progress Notes (Signed)
2130: Pt. Converted back into A-fib RVR w/ HR in 170's.  Alerted [facility], who confirmed A-fib RVR not SVT.  Turned gtt up to max, and when HR still sustaining above 140's paged MD's   Paged and spoke with Cardiology- Dr. Saralyn Pilar and the Hospitalist- Dr. Jannifer Franklin. Discussed current Vitals, previous events of the day, current medications PO and gtt.s as well as dosing amounts. Suggested PRN to help control HR- ie. Lopressor.  Dr. Saralyn Pilar did not want to add any medications to what the pt. Was taking already unless the pt. Became symptomatic.  Alerted Dr. Jannifer Franklin that the pt. Normally takes Topralol extended release once a day at home which was ordered to start when the pt. Moved to the floor. Requested to start that medication this evening to help wean pt. Off dilt. Gtt more easily and have added control of HR.  Will continue to monitor pt. Closely.

## 2015-12-29 NOTE — Progress Notes (Signed)
Nutrition Follow-up  DOCUMENTATION CODES:   Not applicable  INTERVENTION:  -Recommend addition of Glucerna Shake po BID, each supplement provides 220 kcal and 10 grams of protein  NUTRITION DIAGNOSIS:   Unintentional weight loss related to acute illness as evidenced by percent weight loss.  GOAL:   Patient will meet greater than or equal to 90% of their needs  MONITOR:   PO intake, Supplement acceptance, Labs, Weight trends  REASON FOR ASSESSMENT:   Malnutrition Screening Tool    ASSESSMENT:    65 yo male admitted with afib with RVR, DKA; pt with hx of recent resection of frontal lobe brain tumor on 12/16/15  Pt reports appetite has been good; reports 15 pound wt loss in past few months; 6.4% wt loss. Pt with good appetite on visit today, wanting something to drink  Nutrition-Focused physical exam completed. Findings are WDL for fat depletion, muscle depletion, and edema.   Past Medical History:  Diagnosis Date  . Arthritis   . Brain tumor ([place]) 8-9/17   unsure if cancerous at this time  . Diabetes mellitus without complication (HCC)    borderline  . Dysrhythmia    tachycardia  . GERD (gastroesophageal reflux disease)    takes OTC MEDS  . Headache   . Kidney stones    cant remember when  . Pre-diabetes   . Sleep apnea    back in 2000.  Hasn't been retested  . Tachycardia      Diet Order:  Diet heart healthy/carb modified Room service appropriate? Yes; Fluid consistency: Thin  Skin:  Reviewed, no issues  Last BM:  8/28   Labs:  Glucose Profile:   Recent Labs  12/29/15 1007 12/29/15 1103 12/29/15 1203  GLUCAP 200* 185* 154*   Meds: insulin drip  Height:   Ht Readings from Last 1 Encounters:  12/28/15 '5\' 11"'$  (1.803 m)    Weight:   Wt Readings from Last 1 Encounters:  12/28/15 220 lb (99.8 kg)    Filed Weights   12/28/15 1736  Weight: 220 lb (99.8 kg)    BMI:  Body mass index is 30.68 kg/m.  Estimated Nutritional Needs:   Kcal:   2200-2500 kcals   Protein:  100-120 g  Fluid:  >/= 2 L  EDUCATION NEEDS:   No education needs identified at this time  [place], [place], [place] [phone] Pager  [phone] Weekend/On-Call Pager

## 2015-12-29 NOTE — Progress Notes (Signed)
RN paged Dr. Saralyn Pilar to make him aware that patient's cardizem drip was stopped at 1700 and at that time patient had converted to NSR but that now at 1740 patient is back in aflutter rate as high as 171.  Cardizem drip started back.

## 2015-12-29 NOTE — Progress Notes (Signed)
Christina, RN(charge nurse) spoke with Dr. Saralyn Pilar and made MD aware that patient's IV went bad around 1315 and patient's heart rate went up to 180 aflutter but since new IV started patient has converted to NSR and is on 5 mg/H of cardizem.  MD gave order to stop cardizem drip and send patient to 2A.

## 2015-12-30 LAB — CBC
HCT: 40.9 % (ref 40.0–52.0)
Hemoglobin: 14.6 g/dL (ref 13.0–18.0)
MCH: 32.5 pg (ref 26.0–34.0)
MCHC: 35.8 g/dL (ref 32.0–36.0)
MCV: 90.7 fL (ref 80.0–100.0)
PLATELETS: 86 10*3/uL — AB (ref 150–440)
RBC: 4.51 MIL/uL (ref 4.40–5.90)
RDW: 13.9 % (ref 11.5–14.5)
WBC: 10.8 10*3/uL — ABNORMAL HIGH (ref 3.8–10.6)

## 2015-12-30 LAB — BASIC METABOLIC PANEL
Anion gap: 7 (ref 5–15)
BUN: 25 mg/dL — AB (ref 6–20)
CHLORIDE: 100 mmol/L — AB (ref 101–111)
CO2: 24 mmol/L (ref 22–32)
Calcium: 7.8 mg/dL — ABNORMAL LOW (ref 8.9–10.3)
Creatinine, Ser: 0.51 mg/dL — ABNORMAL LOW (ref 0.61–1.24)
GFR calc Af Amer: >60 mL/min (ref >60)
GLUCOSE: 109 mg/dL — AB (ref 65–99)
POTASSIUM: 3.4 mmol/L — AB (ref 3.5–5.1)
Sodium: 131 mmol/L — ABNORMAL LOW (ref 135–145)

## 2015-12-30 LAB — GLUCOSE, CAPILLARY
GLUCOSE-CAPILLARY: 196 mg/dL — AB (ref 65–99)
Glucose-Capillary: 99 mg/dL (ref 65–99)
Glucose-Capillary: 70 mg/dL (ref 65–99)
Glucose-Capillary: 152 mg/dL — ABNORMAL HIGH (ref 65–99)
Glucose-Capillary: 100 mg/dL — ABNORMAL HIGH (ref 65–99)
Glucose-Capillary: 177 mg/dL — ABNORMAL HIGH (ref 65–99)

## 2015-12-30 MED ORDER — NORTRIPTYLINE HCL 10 MG PO CAPS
10.0000 mg | ORAL_CAPSULE | Freq: Every day | ORAL | Status: DC
  Administered 2015-12-30: 10 mg via ORAL
  Filled 2015-12-30: qty 1

## 2015-12-30 MED ORDER — HYDROCODONE-ACETAMINOPHEN 5-325 MG PO TABS: 1.0000 | ORAL_TABLET | ORAL | Status: DC | PRN

## 2015-12-30 MED ORDER — FLUTICASONE PROPIONATE 50 MCG/ACT NA SUSP
2.0000 | Freq: Every day | NASAL | Status: DC | PRN
  Filled 2015-12-30: qty 16

## 2015-12-30 MED ORDER — DILTIAZEM HCL ER COATED BEADS 180 MG PO CP24: 300.0000 mg | ORAL_CAPSULE | Freq: Every day | ORAL | Status: DC

## 2015-12-30 MED ORDER — DEXAMETHASONE 4 MG PO TABS
2.0000 mg | ORAL_TABLET | Freq: Two times a day (BID) | ORAL | Status: DC
  Administered 2015-12-30 – 2015-12-31 (×2): 2 mg via ORAL
  Filled 2015-12-30 (×2): qty 0.5
  Filled 2015-12-30: qty 1

## 2015-12-30 MED ORDER — ROSUVASTATIN CALCIUM 10 MG PO TABS
10.0000 mg | ORAL_TABLET | Freq: Every day | ORAL | Status: DC
  Administered 2015-12-30 – 2015-12-31 (×2): 10 mg via ORAL
  Filled 2015-12-30 (×2): qty 1

## 2015-12-30 MED ORDER — INSULIN GLARGINE 100 UNIT/ML ~~LOC~~ SOLN
12.0000 [IU] | Freq: Every day | SUBCUTANEOUS | Status: DC
  Administered 2015-12-30 – 2015-12-31 (×2): 12 [IU] via SUBCUTANEOUS
  Filled 2015-12-30 (×2): qty 0.12

## 2015-12-30 MED ORDER — LIVING WELL WITH DIABETES BOOK
Freq: Once | Status: DC
  Filled 2015-12-30: qty 1

## 2015-12-30 MED ORDER — METOPROLOL SUCCINATE ER 50 MG PO TB24
50.0000 mg | ORAL_TABLET | Freq: Every day | ORAL | Status: DC
  Administered 2015-12-30 – 2015-12-31 (×2): 50 mg via ORAL
  Filled 2015-12-30 (×3): qty 1

## 2015-12-30 NOTE — Progress Notes (Signed)
Alerted Dr. Marcille Blanco to pt.'s non-sustaining drop in HR to mid 40's.  Pt. Is also having multiple pauses back to back. Pt. Is still flipping back and forth between NSR/A-fib/A-flutter  Will continue to monitor pt. Closely.

## 2015-12-30 NOTE — Progress Notes (Signed)
Report called to Remo Lipps, RN on 2A.  Patient is A&Ox4 with no complaints of pain.  VSS.  NSR per cardiac monitor.  Patient moved to room 237 by wheelchair on tele monitor with Dixie, NT.  Wife at side.

## 2015-12-30 NOTE — Progress Notes (Signed)
[facility] Cardiology  SUBJECTIVE: I feel better   Vitals:   12/30/15 0500 12/30/15 0600 12/30/15 0700 12/30/15 0703  BP: '91/67 96/69 94/67 '$   Pulse: (!) 56 (!) 49 83 61  Resp: '16 15 17 18  '$ Temp:      TempSrc:      SpO2: 95% 91% (!) 89% 96%  Weight:      Height:         Intake/Output Summary (Last 24 hours) at 12/30/15 0744 Last data filed at 12/30/15 0700  Gross per 24 hour  Intake           914.35 ml  Output             1050 ml  Net          -135.65 ml      PHYSICAL EXAM  General: Well developed, well nourished, in no acute distress HEENT:  Normocephalic and atramatic Neck:  No JVD.  Lungs: Clear bilaterally to auscultation and percussion. Heart: HRRR . Normal S1 and S2 without gallops or murmurs.  Abdomen: Bowel sounds are positive, abdomen soft and non-tender  Msk:  Back normal, normal gait. Normal strength and tone for age. Extremities: No clubbing, cyanosis or edema.   Neuro: Alert and oriented X 3. Psych:  Good affect, responds appropriately   LABS: Basic Metabolic Panel:  Recent Labs  12/29/15 1020 12/30/15 0317  NA 131* 131*  K 3.6 3.4*  CL 100* 100*  CO2 27 24  GLUCOSE 188* 109*  BUN 24* 25*  CREATININE 0.51* 0.51*  CALCIUM 7.9* 7.8*   Liver Function Tests:  Recent Labs  12/28/15 1749  AST 37  ALT 51  ALKPHOS 59  BILITOT 0.7  PROT 6.4*  ALBUMIN 2.9*   No results for input(s): LIPASE, AMYLASE in the last 72 hours. CBC:  Recent Labs  12/29/15 0633 12/30/15 0317  WBC 12.9* 10.8*  HGB 14.2 14.6  HCT 40.9 40.9  MCV 91.5 90.7  PLT 93* 86*   Cardiac Enzymes:  Recent Labs  12/29/15 0218 12/29/15 0633 12/29/15 1416  TROPONINI 0.04* 0.05* 0.04*   BNP: Invalid input(s): POCBNP D-Dimer: No results for input(s): DDIMER in the last 72 hours. Hemoglobin A1C:  Recent Labs  12/29/15 0633  HGBA1C 9.2*   Fasting Lipid Panel: No results for input(s): CHOL, HDL, LDLCALC, TRIG, CHOLHDL, LDLDIRECT in the last 72 hours. Thyroid  Function Tests: No results for input(s): TSH, T4TOTAL, T3FREE, THYROIDAB in the last 72 hours.  Invalid input(s): FREET3 Anemia Panel: No results for input(s): VITAMINB12, FOLATE, FERRITIN, TIBC, IRON, RETICCTPCT in the last 72 hours.  No results found.   Echo normal left ventricular function, with LV ejection fraction 50-55%  TELEMETRY: Normal sinus rhythm with PACs:  ASSESSMENT AND PLAN:  Active Problems:   Atrial fibrillation with RVR ([place])    1. Atrial flutter with variable conduction, converted to sinus rhythm 2. Borderline elevated troponin, and the absence of chest pain, likely due to demand supply ischemia  Recommendations  1. Continue current medications 2. Change Cardizem to Cardizem CD 300 mg daily 3. Continue amiodarone 400 mg twice a day, decrease to 200 mg twice a day in one week as outpatient     David Leicht, MD, PhD, [facility] 12/30/2015 7:44 AM

## 2015-12-30 NOTE — Evaluation (Signed)
Physical Therapy Evaluation Patient Details Name: David Archer MRN: [id number] DOB: [birth year] Today's Date: 12/30/2015   History of Present Illness  Pt admitted for Afib. Pt with complaints of tachycardia. 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 Per RN, HR stable and PT can initiate consult. Pt performs bed mobility independently, transfers with mod I, and ambulation with cga and no AD. Pt demonstrates deficits with strength/endurance/balance. Would benefit from skilled PT to address above deficits and promote optimal return to PLOF. Recommend transition to [place] upon discharge from acute hospitalization.       Follow Up Recommendations Home health PT    Equipment Recommendations       Recommendations for Other Services       Precautions / Restrictions Precautions Precautions: Fall Restrictions Weight Bearing Restrictions: No      Mobility  Bed Mobility Overal bed mobility: Independent Bed Mobility: Supine to Sit     Supine to sit: Independent     General bed mobility comments: safe technique performed. Once seated at EOB, pt with no balance issues  Transfers Overall transfer level: Modified independent Equipment used: 1 person hand held assist Transfers: Sit to/from Stand Sit to Stand: Modified independent (Device/Increase time)         General transfer comment: safe technique performed with no AD.  Ambulation/Gait Ambulation/Gait assistance: Min guard Ambulation Distance (Feet): 80 Feet Assistive device: None Gait Pattern/deviations: Step-to pattern Gait velocity: decreased   General Gait Details: ambulation performed with slow guarded technique. Pt fatigues with increased distance. No LOB noted. No AD required  Stairs            Wheelchair Mobility    Modified Rankin (Stroke Patients  Only)       Balance Overall balance assessment: Needs assistance Sitting-balance support: Feet supported Sitting balance-Leahy Scale: Normal     Standing balance support: Bilateral upper extremity supported Standing balance-Leahy Scale: Good                               Pertinent Vitals/Pain Pain Assessment: No/denies pain    Home Living Family/patient expects to be discharged to:: Private residence Living Arrangements: Spouse/significant other Available Help at Discharge: Family;Available 24 hours/day Type of Home: House Home Access: Stairs to enter Entrance Stairs-Rails: Can reach both Entrance Stairs-Number of Steps: 2 Home Layout: One level Home Equipment: Walker - 2 wheels;Cane - single point      Prior Function Level of Independence: Independent         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 All  ther-ex performed x cga with 1 rest break required      Assessment/Plan    PT Assessment Patient needs continued PT services  PT Diagnosis Difficulty walking;Generalized weakness   PT Problem List Decreased strength;Decreased activity tolerance;Decreased mobility  PT Treatment Interventions Gait training;DME instruction;Therapeutic exercise;Balance training   PT Goals (Current goals can be found in the Care Plan section) Acute Rehab PT  Goals Patient Stated Goal: to go home PT Goal Formulation: With patient Time For Goal Achievement: 01/13/16 Potential to Achieve Goals: Good    Frequency Min 2X/week   Barriers to discharge        Co-evaluation               End of Session Equipment Utilized During Treatment: Gait belt Activity Tolerance: Patient tolerated treatment well Patient left: in chair (no chair alarm present)           Time: 0160-1093 PT Time Calculation (min) (ACUTE ONLY): 14 min   Charges:   PT Evaluation $PT Eval Moderate Complexity: 1 Procedure PT Treatments $Therapeutic Exercise: 8-22 mins   PT G Codes:        Launi Asencio January 01, 2016, 1:07 PM  Greggory Stallion, PT, DPT [phone]

## 2015-12-30 NOTE — Progress Notes (Signed)
RN spoke with Dr. Posey Pronto and discussed patient's stable heart rate and vital signs.  MD gave order for patient to transfer to 2A.

## 2015-12-30 NOTE — Progress Notes (Addendum)
Inpatient Diabetes Program Recommendations  AACE/ADA: New Consensus Statement on Inpatient Glycemic Control (2015)  Target Ranges:  Prepandial:   less than 140 mg/dL      Peak postprandial:   less than 180 mg/dL (1-2 hours)      Critically ill patients:  140 - 180 mg/dL   Lab Results  Component Value Date   GLUCAP 100 (H) 12/30/2015   HGBA1C 9.2 (H) 12/29/2015    Review of Glycemic Control:  Results for [name], [name] (MRN [id number]) as of 12/30/2015 13:14  Ref. Range 12/29/2015 12:03 12/29/2015 16:50 12/30/2015 07:20 12/30/2015 10:14 12/30/2015 12:25  Glucose-Capillary Latest Ref Range: 65 - 99 mg/dL 154 (H) 121 (H) 70 152 (H) 100 (H)   Diabetes history: Borderline? Diabetes  Outpatient Diabetes medications: None Current orders for Inpatient glycemic control:  Lantus 12 units daily, Novolog sensitive tid with meals  Inpatient Diabetes Program Recommendations:    Note that blood sugars improved today with very little basal insulin. May not need insulin when he is not on steroids?  Consider adding oral agent such as Metformin 500 mg bid and d/c Lantus?  However if patient is placed back on steroids, he will likely need Novolog correction and close monitoring to make sure that blood sugars do not increase.   Will discuss with patient and wife.  Thanks, Adah Perl, RN, BC-ADM Inpatient Diabetes Coordinator Pager [phone] ([phone]- spoke with patient and explained that blood sugars are improved.  May not need insulin when he is not on steroids at home.  Told him that he needs glucose meter and to monitor closely.  Will order Living Well with diabetes booklet.  Consider adding oral agent for diabetes at discharge.  He will need close follow-up with PCP. Thanks,  Adah Perl, RN, BC-ADM I

## 2015-12-30 NOTE — Progress Notes (Signed)
[place] [facility]                                                                                                                                                                                            Patient Demographics   David Archer, is a 65 y.o. male, DOB - Aug 22, 1950, NHA:[id number]  Admit date - 12/28/2015   Admitting Physician Nicholes Mango, MD  Outpatient Primary MD for the patient is Sherrin Daisy, MD   LOS - 1  Subjective: Patient yesterday had again atrial fibrillation went into the 130s. Subsequently received his medications and heart rate dropped into the 30s. Currently heart rate is stable but blood pressure is low. He feels well denies any chest pain or shortness of breath   Review of Systems:   CONSTITUTIONAL: No documented fever. No fatigue, weakness. No weight gain, no weight loss.  EYES: No blurry or double vision.  ENT: No tinnitus. No postnasal drip. No redness of the oropharynx.  RESPIRATORY: No cough, no wheeze, no hemoptysis. No dyspnea.  CARDIOVASCULAR: No chest pain. No orthopnea. No palpitations. No syncope.  GASTROINTESTINAL: No nausea, no vomiting or diarrhea. No abdominal pain. No melena or hematochezia.  GENITOURINARY: No dysuria or hematuria.  ENDOCRINE: No polyuria or nocturia. No heat or cold intolerance.  HEMATOLOGY: No anemia. No bruising. No bleeding.  INTEGUMENTARY: No rashes. No lesions.  MUSCULOSKELETAL: No arthritis. No swelling. No gout.  NEUROLOGIC: No numbness, tingling, or ataxia. No seizure-type activity.  PSYCHIATRIC: No anxiety. No insomnia. No ADD.    Vitals:   Vitals:   12/30/15 0900 12/30/15 1000 12/30/15 1017 12/30/15 1100  BP: 95/69 110/68  102/81  Pulse: 64 70 74   Resp: '17 17  11  '$ Temp:      TempSrc:      SpO2: 95% 95%    Weight:      Height:        Wt Readings from Last 3 Encounters:  12/28/15 99.8 kg (220 lb)  12/16/15 108.9 kg (240 lb)  08/17/15 107.4 kg (236  lb 12.4 oz)     Intake/Output Summary (Last 24 hours) at 12/30/15 1216 Last data filed at 12/30/15 1021  Gross per 24 hour  Intake           375.35 ml  Output             1550 ml  Net         -1174.65 ml    Physical Exam:   GENERAL: Pleasant-appearing in no apparent distress.  HEAD, EYES, EARS, NOSE AND THROAT: Atraumatic, normocephalic. Extraocular muscles are  intact. Pupils equal and reactive to light. Sclerae anicteric. No conjunctival injection. No oro-pharyngeal erythema.  NECK: Supple. There is no jugular venous distention. No bruits, no lymphadenopathy, no thyromegaly.  HEART: Irregularly irregular No murmurs, no rubs, no clicks.  LUNGS: Clear to auscultation bilaterally. No rales or rhonchi. No wheezes.  ABDOMEN: Soft, flat, nontender, nondistended. Has good bowel sounds. No hepatosplenomegaly appreciated.  EXTREMITIES: No evidence of any cyanosis, clubbing, or peripheral edema.  +2 pedal and radial pulses bilaterally.  NEUROLOGIC: The patient is alert, awake, and oriented x3 with no focal motor or sensory deficits appreciated bilaterally.  SKIN: Moist and warm with no rashes appreciated.  Psych: Not anxious, depressed LN: No inguinal LN enlargement    Antibiotics   Anti-infectives    None      Medications   Scheduled Meds: . amiodarone  400 mg Oral BID  . docusate sodium  100 mg Oral BID  . feeding supplement (GLUCERNA SHAKE)  237 mL Oral BID BM  . heparin subcutaneous  5,000 Units Subcutaneous Q8H  . insulin aspart  0-9 Units Subcutaneous TID WC  . insulin glargine  12 Units Subcutaneous Daily  . metoprolol succinate  50 mg Oral Daily  . pantoprazole  40 mg Oral QAC breakfast   Continuous Infusions: . sodium chloride     PRN Meds:.acetaminophen **OR** acetaminophen, ondansetron **OR** ondansetron (ZOFRAN) IV   Data Review:   Micro Results No results found for this or any previous visit (from the past 240 hour(s)).  Radiology Reports Ct Head Wo  Contrast  Result Date: 12/16/2015 CLINICAL DATA:  Craniotomy today.  aphasia. EXAM: CT HEAD WITHOUT CONTRAST TECHNIQUE: Contiguous axial images were obtained from the base of the skull through the vertex without intravenous contrast. COMPARISON:  MRI 12/09/2015 FINDINGS: Interval left frontal craniotomy. Partial resection of a large cystic mass in the left frontal lobe. There is some hemorrhage within the post resection space as would be expected. The area of hemorrhage measures approximately 3.5 cm. Surrounding brain shows low density consistent with edema. I do not see evidence of infarction outside of the operative field or region of edema. There is diminished mass effect with right-to-left shift of only 3 mm presently. No hydrocephalus or intraventricular hemorrhage. No extra-axial collection. IMPRESSION: Good appearance following debulking of a left frontal tumor. Less mass effect with left-to-right shift 3 mm. Blood products in the surgical bed as expected. Low-density in the region consistent with residual edema. No finding to suggest infarction outside of the surgical field. Electronically Signed   By: Nelson Chimes M.D.   On: 12/16/2015 19:06   [name] Contrast  Result Date: 12/18/2015 CLINICAL DATA:  Continued surveillance following LEFT frontal resection. EXAM: MRI HEAD WITHOUT CONTRAST TECHNIQUE: Multiplanar, multiecho pulse sequences of the brain and surrounding structures were obtained without intravenous contrast. COMPARISON:  Multiple priors, most recent CT 12/16/2015. Most recent MR 12/09/2015. FINDINGS: According to the technologist, the patient became unable to cooperate for completion of the exam. Contrast was not administered. When the patient becomes more stable, post infusion imaging could be performed as a separate exam. Today's examination is reported as a MR brain without. LEFT frontal resection cavity is redemonstrated. Restricted diffusion surrounding the cavity, most notable  medially (image 20 series 4) represents perioperative ischemia. Moderate residual vasogenic edema. Left-to-right shift is measured at septum pellucidum level on image 16 is 3 mm. Acute and subacute blood products within the resection cavity are an expected postoperative finding. No significant extra-axial fluid.  Baseline atrophy without significant chronic microvascular ischemic change. Unremarkable pituitary and cerebellar tonsils. Chronic LEFT maxillary sinus disease. Negative orbits. IMPRESSION: Only noncontrast images could be obtained. Baseline examination for postcontrast enhancement/assessment or residual tumor was not possible because the patient was unable to cooperate. Post infusion imaging could be performed at a later date if the patient is able to remain motionless. Perioperative ischemia, most notable in the medial frontal lobe. Moderate residual vasogenic edema with only slight left-to-right shift. Electronically Signed   By: Staci Righter M.D.   On: 12/18/2015 13:52   Mr Jeri Cos BT Contrast  Result Date: 12/09/2015 CLINICAL DATA:  Left frontal brain tumor. Gliosis without definite neoplasm on biopsy 08/17/2015 EXAM: MRI HEAD WITHOUT AND WITH CONTRAST TECHNIQUE: Multiplanar, multiecho pulse sequences of the brain and surrounding structures were obtained without and with intravenous contrast. CONTRAST:  59m MULTIHANCE GADOBENATE DIMEGLUMINE 529 MG/ML IV SOLN COMPARISON:  MRI brain 11/24/2015. FINDINGS: A peripherally enhancing mass is again noted in the anterior left frontal lobe. The dimensions are slightly smaller than on the most recent exam. This is often seen in the setting of steroid treatment. Subfalcine herniation measures 5 mm, not significantly changed. Diffuse left frontal lobe edema is similar to the prior studies. No new areas of enhancement are present. No remote areas of increased T2 signal are present. The internal auditory canals are within normal limits. Flow is present in the  major intracranial arteries. The globes and orbits are intact. Chronic left maxillary sinus disease is again seen. The remaining paranasal sinuses and the mastoid air cells are clear. The skullbase is within normal limits. Midline sagittal structures are otherwise unremarkable. IMPRESSION: 1. Peripherally enhancing left frontal lobe mass lesion is again seen. The overall size of lesion is smaller. The thickness of enhancement is increased. The areas of restricted diffusion are unchanged. 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 Electronically Signed   By: CSan MorelleM.D.   On: 12/09/2015 16:21     CBC  Recent Labs Lab 12/28/15 1749 12/29/15 0633 12/30/15 0317  WBC 15.7* 12.9* 10.8*  HGB 16.1 14.2 14.6  HCT 48.0 40.9 40.9  PLT 125* 93* 86*  MCV 95.1 91.5 90.7  MCH 31.8 31.7 32.5  MCHC 33.5 34.7 35.8  RDW 14.0 13.9 13.9    Chemistries   Recent Labs Lab 12/28/15 1749 12/29/15 0218 12/29/15 0633 12/29/15 1020 12/30/15 0317  NA 128* 133* 132* 131* 131*  K 4.7 3.7 3.9 3.6 3.4*  CL 92* 102 101 100* 100*  CO2 18* '22 22 27 24  '$ GLUCOSE 565* 252* 261* 188* 109*  BUN 37* 31* 26* 24* 25*  CREATININE 1.06 0.64 0.52* 0.51* 0.51*  CALCIUM 9.0 8.1* 7.8* 7.9* 7.8*  AST 37  --   --   --   --   ALT 51  --   --   --   --   ALKPHOS 59  --   --   --   --   BILITOT 0.7  --   --   --   --    ------------------------------------------------------------------------------------------------------------------ estimated creatinine clearance is 113.8 mL/min (by C-G formula based on SCr of 0.8 mg/dL). ------------------------------------------------------------------------------------------------------------------  Recent Labs  12/29/15 0633  HGBA1C 9.2*   ------------------------------------------------------------------------------------------------------------------ No results for input(s): CHOL, HDL, LDLCALC, TRIG, CHOLHDL, LDLDIRECT in the last 72  hours. ------------------------------------------------------------------------------------------------------------------ No results for input(s): TSH, T4TOTAL, T3FREE, THYROIDAB in the last 72 hours.  Invalid input(s): FREET3 ------------------------------------------------------------------------------------------------------------------ No results for input(s): VITAMINB12,  FOLATE, FERRITIN, TIBC, IRON, RETICCTPCT in the last 72 hours.  Coagulation profile  Recent Labs Lab 12/28/15 1749  INR 1.02    No results for input(s): DDIMER in the last 72 hours.  Cardiac Enzymes  Recent Labs Lab 12/29/15 0218 12/29/15 0633 12/29/15 1416  TROPONINI 0.04* 0.05* 0.04*   ------------------------------------------------------------------------------------------------------------------ Invalid input(s): POCBNP    Assessment & Plan   David Archer  is a 65 y.o. 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 Patient denies any chest pain or palpitations. Heart rate was better after receiving Cardizem 20 mg IV but again, patient became tachycardic and he was started on Cardizem drip. Blood sugar was at 565 and anion gap was at 18.   #  A. fib with RVR Continue amiodarone Due to drop in the heart rate will just continue Cardizem Continue Toprol with holding parameters   #DKA now resolved Hemoglobin A1c 9.2 consistent with diabetes Patient's blood sugar was low this morning Allergies decrease the dose of Lantus   #Elevated troponin is probably from demand ischemia from A. fib with RVR Due to demand ischemia  #Hyponatremia-pseudo-secondary to hyperglycemia from DKA now stable   #Recent history of craniotomy status post frontal lobe tumor resection  diagnosed with small cell cancer Craniotomy site is healing well Continue Decadron   #Obstructive sleep apnea not on CPAP  #Thrombocytopenia which is new currently not on any heparin products continue monitor will need outpatient follow-up  DVT prophylaxis with SCDs All the records are reviewed and case discussed with ED provider. Management plans discussed with the patient, family and they are in agreement.     Code Status Orders        Start     Ordered   12/29/15 0122  Full code  Continuous     12/29/15 0121    Code Status History    Date Active Date Inactive Code Status Order ID Comments User Context   12/16/2015  7:14 PM 12/19/2015  5:07 PM Full Code [id number]  Newman Pies, MD Inpatient           Consults  cardiology   DVT Prophylaxis scd's  Lab Results  Component Value Date   PLT 86 (L) 12/30/2015     Time Spent in minutes   39mn  Greater than 50% of time spent in care coordination and counseling patient regarding the condition and plan of care.   PDustin FlockM.D on 12/30/2015 at 12:16 PM  Between 7am to 6pm - Pager - [phone]  After 6pm go to [email or web address] - password [email or web address]   Office  [phone]

## 2015-12-31 LAB — GLUCOSE, CAPILLARY: Glucose-Capillary: 170 mg/dL — ABNORMAL HIGH (ref 65–99)

## 2015-12-31 MED ORDER — INSULIN GLARGINE 100 UNIT/ML ~~LOC~~ SOLN: 14.0000 [IU] | Freq: Every day | SUBCUTANEOUS | 11 refills | Status: DC

## 2015-12-31 MED ORDER — AMIODARONE HCL 400 MG PO TABS
400.0000 mg | ORAL_TABLET | Freq: Two times a day (BID) | ORAL | 0 refills | Status: DC
Start: 2015-12-31 — End: 2015-12-31

## 2015-12-31 MED ORDER — AMIODARONE HCL 200 MG PO TABS: 200.0000 mg | ORAL_TABLET | Freq: Two times a day (BID) | ORAL | 0 refills | Status: DC

## 2015-12-31 MED ORDER — AMIODARONE HCL 400 MG PO TABS: 400.0000 mg | ORAL_TABLET | Freq: Two times a day (BID) | ORAL | 0 refills | Status: DC

## 2015-12-31 NOTE — Progress Notes (Signed)
Received written order from dr Arnoldo Morale office to remove staples.

## 2015-12-31 NOTE — Discharge Instructions (Signed)
°  DIET:  Diabetic diet  DISCHARGE CONDITION:  Stable  ACTIVITY:  Activity as tolerated  OXYGEN:  Home Oxygen: No.   Oxygen Delivery: room air  DISCHARGE LOCATION:  home    ADDITIONAL DISCHARGE INSTRUCTION:   If you experience worsening of your admission symptoms, develop shortness of breath, life threatening emergency, suicidal or homicidal thoughts you must seek medical attention immediately by calling 911 or calling your MD immediately  if symptoms less severe.  You Must read complete instructions/literature along with all the possible adverse reactions/side effects for all the Medicines you take and that have been prescribed to you. Take any new Medicines after you have completely understood and accpet all the possible adverse reactions/side effects.   Please note  You were cared for by a hospitalist during your hospital stay. If you have any questions about your discharge medications or the care you received while you were in the hospital after you are discharged, you can call the unit and asked to speak with the hospitalist on call if the hospitalist that took care of you is not available. Once you are discharged, your primary care physician will handle any further medical issues. Please note that NO REFILLS for any discharge medications will be authorized once you are discharged, as it is imperative that you return to your primary care physician (or establish a relationship with a primary care physician if you do not have one) for your aftercare needs so that they can reassess your need for medications and monitor your lab values.

## 2015-12-31 NOTE — Care Management (Signed)
Patient for discharge home.  Obtained home health orders and notified [facility]

## 2015-12-31 NOTE — Care Management (Addendum)
Patient admitted to ICU for uncontrolled atrial fib with rvr requiring cardizem drip.  He was transferred to 2A 8/30.  Recent discharge from [facility] for frontal brain tumor resection. he is seek oncology follow up at [facility].  He is currently followed by [facility] and SLP and agency is aware of admission. It is possible patient will require insulin at discharge and nursing staff have been advised by diabetes coordinator to initiate glucose monitor education.  Need to add SN to home health referral

## 2015-12-31 NOTE — Discharge Summary (Signed)
David Archer, 65 y.o., DOB 1950-06-02, MRN [id number]. Admission date: 12/28/2015 Discharge Date 12/31/2015 Primary MD Sherrin Daisy, MD Admitting Physician Nicholes Mango, MD  Admission Diagnosis  Atrial fibrillation with RVR [facility]) [I48.91] Diabetic ketoacidosis without coma associated with type 2 diabetes mellitus ([place]) [E13.10]  Discharge Diagnosis   Active Problems:   Atrial fibrillation with RVR ([place]) Diabetic ketoacidosis with new onset diabetes Recent brain surgery Elevated troponin due to demand ischemia Hyponatremia secondary to hyperglycemia from DKA Recent history of craniotomy status post frontal lobe tumor resection Obstructive sleep apnea     Hospital Course  David Archer  is a 65 y.o. male with a known history of Frontal lobe tumor status post resection at [facility] hospital on 8/16 diagnosed with small cell carcinoma,  Diabetes, Paroxysmal atrial fibrillation only sent over to the emergency department by his home health nurse as his heart rate was at 170. Patient was evaluated in the ER and noted to be in DKA. He is on Decadron. He does not have a history of diabetes. Hemoglobin A1c showed his hemoglobin to be greater than 9. He was started on insulin drip with resolution of his DKA. He started on Lantus now. In terms of his atrial fibrillation with rapid ventricular rate he was initially on a Cardizem drip. Subsequently seen by cardiology and started on amiodarone. Now he is in normal sinus rhythm. Due to his recent brain surgery is not started on any anticoagulation. He'll need to be cleared neurosurgery prior to any starting of anticoagulation. Patient is doing much better denies any complaints no chest pain or shortness of breath once to go home.           Consults  cardiology  Significant Tests:  See full reports for all details     Ct Head Wo Contrast  Result Date: 12/16/2015 CLINICAL DATA:  Craniotomy today.  aphasia. EXAM: CT HEAD WITHOUT CONTRAST  TECHNIQUE: Contiguous axial images were obtained from the base of the skull through the vertex without intravenous contrast. COMPARISON:  MRI 12/09/2015 FINDINGS: Interval left frontal craniotomy. Partial resection of a large cystic mass in the left frontal lobe. There is some hemorrhage within the post resection space as would be expected. The area of hemorrhage measures approximately 3.5 cm. Surrounding brain shows low density consistent with edema. I do not see evidence of infarction outside of the operative field or region of edema. There is diminished mass effect with right-to-left shift of only 3 mm presently. No hydrocephalus or intraventricular hemorrhage. No extra-axial collection. IMPRESSION: Good appearance following debulking of a left frontal tumor. Less mass effect with left-to-right shift 3 mm. Blood products in the surgical bed as expected. Low-density in the region consistent with residual edema. No finding to suggest infarction outside of the surgical field. Electronically Signed   By: Nelson Chimes M.D.   On: 12/16/2015 19:06   [name] Contrast  Result Date: 12/18/2015 CLINICAL DATA:  Continued surveillance following LEFT frontal resection. EXAM: MRI HEAD WITHOUT CONTRAST TECHNIQUE: Multiplanar, multiecho pulse sequences of the brain and surrounding structures were obtained without intravenous contrast. COMPARISON:  Multiple priors, most recent CT 12/16/2015. Most recent MR 12/09/2015. FINDINGS: According to the technologist, the patient became unable to cooperate for completion of the exam. Contrast was not administered. When the patient becomes more stable, post infusion imaging could be performed as a separate exam. Today's examination is reported as a MR brain without. LEFT frontal resection cavity is redemonstrated. Restricted diffusion surrounding the cavity, most  notable medially (image 20 series 4) represents perioperative ischemia. Moderate residual vasogenic edema. Left-to-right  shift is measured at septum pellucidum level on image 16 is 3 mm. Acute and subacute blood products within the resection cavity are an expected postoperative finding. No significant extra-axial fluid. Baseline atrophy without significant chronic microvascular ischemic change. Unremarkable pituitary and cerebellar tonsils. Chronic LEFT maxillary sinus disease. Negative orbits. IMPRESSION: Only noncontrast images could be obtained. Baseline examination for postcontrast enhancement/assessment or residual tumor was not possible because the patient was unable to cooperate. Post infusion imaging could be performed at a later date if the patient is able to remain motionless. Perioperative ischemia, most notable in the medial frontal lobe. Moderate residual vasogenic edema with only slight left-to-right shift. Electronically Signed   By: Staci Righter M.D.   On: 12/18/2015 13:52   Mr David Archer Contrast  Result Date: 12/09/2015 CLINICAL DATA:  Left frontal brain tumor. Gliosis without definite neoplasm on biopsy 08/17/2015 EXAM: MRI HEAD WITHOUT AND WITH CONTRAST TECHNIQUE: Multiplanar, multiecho pulse sequences of the brain and surrounding structures were obtained without and with intravenous contrast. CONTRAST:  53m MULTIHANCE GADOBENATE DIMEGLUMINE 529 MG/ML IV SOLN COMPARISON:  MRI brain 11/24/2015. FINDINGS: A peripherally enhancing mass is again noted in the anterior left frontal lobe. The dimensions are slightly smaller than on the most recent exam. This is often seen in the setting of steroid treatment. Subfalcine herniation measures 5 mm, not significantly changed. Diffuse left frontal lobe edema is similar to the prior studies. No new areas of enhancement are present. No remote areas of increased T2 signal are present. The internal auditory canals are within normal limits. Flow is present in the major intracranial arteries. The globes and orbits are intact. Chronic left maxillary sinus disease is again seen.  The remaining paranasal sinuses and the mastoid air cells are clear. The skullbase is within normal limits. Midline sagittal structures are otherwise unremarkable. IMPRESSION: 1. Peripherally enhancing left frontal lobe mass lesion is again seen. The overall size of lesion is smaller. The thickness of enhancement is increased. The areas of restricted diffusion are unchanged. 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 Electronically Signed   By: CSan MorelleM.D.   On: 12/09/2015 16:21       Today   Subjective:   LMithcell Schumpertfeeling better denies any cp or sob  Objective:   Blood pressure 106/74, pulse 84, temperature 98.2 F (36.8 C), temperature source Oral, resp. rate 18, height '5\' 11"'$  (1.803 m), weight 192 lb 6.4 oz (87.3 kg), SpO2 100 %.  .  Intake/Output Summary (Last 24 hours) at 12/31/15 1416 Last data filed at 12/31/15 1153  Gross per 24 hour  Intake              480 ml  Output              500 ml  Net              -20 ml    Exam VITAL SIGNS: Blood pressure 106/74, pulse 84, temperature 98.2 F (36.8 C), temperature source Oral, resp. rate 18, height '5\' 11"'$  (1.803 m), weight 192 lb 6.4 oz (87.3 kg), SpO2 100 %.  GENERAL:  65y.o.-year-old patient lying in the bed with no acute distress.  EYES: Pupils equal, round, reactive to light and accommodation. No scleral icterus. Extraocular muscles intact.  HEENT: Head atraumatic, normocephalic. Oropharynx and nasopharynx clear.  NECK:  Supple, no jugular venous distention. No thyroid  enlargement, no tenderness.  LUNGS: Normal breath sounds bilaterally, no wheezing, rales,rhonchi or crepitation. No use of accessory muscles of respiration.  CARDIOVASCULAR: S1, S2 normal. No murmurs, rubs, or gallops.  ABDOMEN: Soft, nontender, nondistended. Bowel sounds present. No organomegaly or mass.  EXTREMITIES: No pedal edema, cyanosis, or clubbing.  NEUROLOGIC: Cranial nerves II through XII are intact. Muscle  strength 5/5 in all extremities. Sensation intact. Gait not checked.  PSYCHIATRIC: The patient is alert and oriented x 3.  SKIN: No obvious rash, lesion, or ulcer.   Data Review     CBC w Diff: Lab Results  Component Value Date   WBC 10.8 (H) 12/30/2015   HGB 14.6 12/30/2015   HCT 40.9 12/30/2015   PLT 86 (L) 12/30/2015   CMP: Lab Results  Component Value Date   NA 131 (L) 12/30/2015   K 3.4 (L) 12/30/2015   CL 100 (L) 12/30/2015   CO2 24 12/30/2015   BUN 25 (H) 12/30/2015   CREATININE 0.51 (L) 12/30/2015   PROT 6.4 (L) 12/28/2015   ALBUMIN 2.9 (L) 12/28/2015   BILITOT 0.7 12/28/2015   ALKPHOS 59 12/28/2015   AST 37 12/28/2015   ALT 51 12/28/2015  .  Micro Results No results found for this or any previous visit (from the past 240 hour(s)).      Code Status Orders        Start     Ordered   12/29/15 0122  Full code  Continuous     12/29/15 0121    Code Status History    Date Active Date Inactive Code Status Order ID Comments User Context   12/16/2015  7:14 PM 12/19/2015  5:07 PM Full Code [id number]  Newman Pies, MD Inpatient          Follow-up Information    Sherrin Daisy, MD Follow up in 7 day(s).   Specialty:  Family Medicine Why:  Thursday, September 7th at 1045am, ccs Contact information: [place] [address] [phone]        Teodoro Spray., MD .   Specialty:  Cardiology Why:  Monday, September 11th at 1030am, ccs Contact information: [place] [address] [place] .   Why:  Nurse, Physical therapy, Occupational Therapy, Speech Contact information: [address] [place] [address] [phone]           Discharge Medications     Medication List    STOP taking these medications   diltiazem 360 MG 24 hr capsule Commonly known as:  CARDIZEM CD     TAKE these medications   amiodarone  400 MG tablet Commonly known as:  PACERONE Take 1 tablet (400 mg total) by mouth 2 (two) times daily.   amiodarone 200 MG tablet Commonly known as:  PACERONE Take 1 tablet (200 mg total) by mouth 2 (two) times daily. Start taking on:  01/08/2016   dexamethasone 2 MG tablet Commonly known as:  DECADRON Take 1 tablet (2 mg total) by mouth 2 (two) times daily.   fluticasone 50 MCG/ACT nasal spray Commonly known as:  FLONASE Place 2 sprays into both nostrils daily as needed for allergies or rhinitis.   HYDROcodone-acetaminophen 5-325 MG tablet Commonly known as:  NORCO/VICODIN Take 1-2 tablets by mouth every 4 (four) hours as needed for moderate pain.   insulin glargine 100 UNIT/ML injection Commonly known as:  LANTUS Inject 0.14  mLs (14 Units total) into the skin daily.   metoprolol succinate 100 MG 24 hr tablet Commonly known as:  TOPROL-XL Take 1 tablet (100 mg total) by mouth daily. Take with or immediately following a meal.   nortriptyline 10 MG capsule Commonly known as:  PAMELOR Take 10 mg by mouth at bedtime.   rosuvastatin 10 MG tablet Commonly known as:  CRESTOR Take 10 mg by mouth daily.          Total Time in preparing paper work, data evaluation and todays exam - 35 minutes  Dustin Flock M.D on 12/31/2015 at 2:16 PM  [facility]   Office  [phone]

## 2015-12-31 NOTE — Progress Notes (Addendum)
Inpatient Diabetes Program Recommendations  AACE/ADA: New Consensus Statement on Inpatient Glycemic Control (2015)  Target Ranges:  Prepandial:   less than 140 mg/dL      Peak postprandial:   less than 180 mg/dL (1-2 hours)      Critically ill patients:  140 - 180 mg/dL   Lab Results  Component Value Date   GLUCAP 196 (H) 12/30/2015   HGBA1C 9.2 (H) 12/29/2015    Review of Glycemic Control  Results for David Archer, David Archer (MRN [id number]) as of 12/31/2015 10:04  Ref. Range 12/30/2015 10:14 12/30/2015 12:25 12/30/2015 16:51 12/30/2015 20:55 12/31/2015 07:26  Glucose-Capillary Latest Ref Range: 65 - 99 mg/dL 152 (H) 100 (H) 177 (H) 196 (H) 170 (H)    Diabetes history: Borderline? Diabetes  Outpatient Diabetes medications: None Current orders for Inpatient glycemic control: Lantus 12 units daily, Novolog sensitive (0-9units) tid with meals  *steroids  Inpatient Diabetes Program Recommendations:  See diabetes consult note dates 12/30/15 '@1'$ :14pm  Spoke to patient about the potential side effects of Metformin- educated the patient that he should take this medication on a full stomach (after supper) to decrease gi upset.   Gentry Fitz, RN, BA, MHA, CDE Diabetes Coordinator Inpatient Diabetes Program  [phone] (Team Pager) [phone] ([place]) 12/31/2015 10:06 AM

## 2015-12-31 NOTE — Progress Notes (Signed)
A&O. Independent. No complaints. Slept well through the night.

## 2015-12-31 NOTE — Progress Notes (Signed)
29 staples removed from patient t forehead as per written order, patient is being discharge home with home health,  Insulin injection teaching provided with returned demonstration, discharge instruction provided, iv removed tele removed

## 2016-03-26 ENCOUNTER — Encounter: Payer: Self-pay | Admitting: Emergency Medicine

## 2016-03-26 ENCOUNTER — Inpatient Hospital Stay
Admission: EM | Admit: 2016-03-26 | Discharge: 2016-03-27 | DRG: 391 | Disposition: A | Discharge status: Home / Self Care | Payer: BLUE CROSS/BLUE SHIELD | Attending: Internal Medicine | Admitting: Internal Medicine

## 2016-03-26 DIAGNOSIS — I10 Essential (primary) hypertension: Secondary | ICD-10-CM | POA: Diagnosis present

## 2016-03-26 DIAGNOSIS — Z9889 Other specified postprocedural states: Secondary | ICD-10-CM

## 2016-03-26 DIAGNOSIS — Z85118 Personal history of other malignant neoplasm of bronchus and lung: Secondary | ICD-10-CM | POA: Diagnosis not present

## 2016-03-26 DIAGNOSIS — E119 Type 2 diabetes mellitus without complications: Secondary | ICD-10-CM | POA: Diagnosis present

## 2016-03-26 DIAGNOSIS — Z794 Long term (current) use of insulin: Secondary | ICD-10-CM | POA: Diagnosis not present

## 2016-03-26 DIAGNOSIS — K529 Noninfective gastroenteritis and colitis, unspecified: Secondary | ICD-10-CM | POA: Diagnosis present

## 2016-03-26 DIAGNOSIS — D61810 Antineoplastic chemotherapy induced pancytopenia: Secondary | ICD-10-CM | POA: Diagnosis present

## 2016-03-26 DIAGNOSIS — T451X5A Adverse effect of antineoplastic and immunosuppressive drugs, initial encounter: Secondary | ICD-10-CM | POA: Diagnosis present

## 2016-03-26 DIAGNOSIS — Z8249 Family history of ischemic heart disease and other diseases of the circulatory system: Secondary | ICD-10-CM

## 2016-03-26 DIAGNOSIS — G4730 Sleep apnea, unspecified: Secondary | ICD-10-CM | POA: Diagnosis present

## 2016-03-26 DIAGNOSIS — M1990 Unspecified osteoarthritis, unspecified site: Secondary | ICD-10-CM | POA: Diagnosis present

## 2016-03-26 DIAGNOSIS — Z923 Personal history of irradiation: Secondary | ICD-10-CM | POA: Diagnosis not present

## 2016-03-26 DIAGNOSIS — E876 Hypokalemia: Secondary | ICD-10-CM | POA: Diagnosis present

## 2016-03-26 DIAGNOSIS — Z7951 Long term (current) use of inhaled steroids: Secondary | ICD-10-CM

## 2016-03-26 DIAGNOSIS — K219 Gastro-esophageal reflux disease without esophagitis: Secondary | ICD-10-CM | POA: Diagnosis present

## 2016-03-26 DIAGNOSIS — C7931 Secondary malignant neoplasm of brain: Secondary | ICD-10-CM | POA: Diagnosis present

## 2016-03-26 DIAGNOSIS — Z87891 Personal history of nicotine dependence: Secondary | ICD-10-CM | POA: Diagnosis not present

## 2016-03-26 DIAGNOSIS — I482 Chronic atrial fibrillation: Secondary | ICD-10-CM | POA: Diagnosis present

## 2016-03-26 DIAGNOSIS — R5383 Other fatigue: Secondary | ICD-10-CM

## 2016-03-26 DIAGNOSIS — E860 Dehydration: Secondary | ICD-10-CM | POA: Diagnosis present

## 2016-03-26 DIAGNOSIS — Z79899 Other long term (current) drug therapy: Secondary | ICD-10-CM | POA: Diagnosis not present

## 2016-03-26 DIAGNOSIS — Z87442 Personal history of urinary calculi: Secondary | ICD-10-CM | POA: Diagnosis not present

## 2016-03-26 DIAGNOSIS — I480 Paroxysmal atrial fibrillation: Secondary | ICD-10-CM | POA: Diagnosis present

## 2016-03-26 DIAGNOSIS — R197 Diarrhea, unspecified: Secondary | ICD-10-CM

## 2016-03-26 LAB — COMPREHENSIVE METABOLIC PANEL
ALBUMIN: 3.5 g/dL (ref 3.5–5.0)
ALK PHOS: 61 U/L (ref 38–126)
ALT: 21 U/L (ref 17–63)
AST: 25 U/L (ref 15–41)
Anion gap: 14 (ref 5–15)
BILIRUBIN TOTAL: 1 mg/dL (ref 0.3–1.2)
BUN: 22 mg/dL — AB (ref 6–20)
CALCIUM: 9.1 mg/dL (ref 8.9–10.3)
CO2: 25 mmol/L (ref 22–32)
CREATININE: 0.86 mg/dL (ref 0.61–1.24)
Chloride: 94 mmol/L — ABNORMAL LOW (ref 101–111)
GFR calc Af Amer: >60 mL/min (ref >60)
GLUCOSE: 99 mg/dL (ref 65–99)
Potassium: 3.1 mmol/L — ABNORMAL LOW (ref 3.5–5.1)
Sodium: 133 mmol/L — ABNORMAL LOW (ref 135–145)
TOTAL PROTEIN: 7.7 g/dL (ref 6.5–8.1)

## 2016-03-26 LAB — CBC WITH DIFFERENTIAL/PLATELET
BASOS ABS: 0 10*3/uL (ref 0–0.1)
BASOS PCT: 0 %
Eosinophils Absolute: 0 10*3/uL (ref 0–0.7)
Eosinophils Relative: 1 %
HEMATOCRIT: 32 % — AB (ref 40.0–52.0)
HEMOGLOBIN: 11.1 g/dL — AB (ref 13.0–18.0)
LYMPHS PCT: 52 %
Lymphs Abs: 0.9 10*3/uL — ABNORMAL LOW (ref 1.0–3.6)
MCH: 34.3 pg — ABNORMAL HIGH (ref 26.0–34.0)
MCHC: 34.6 g/dL (ref 32.0–36.0)
MCV: 98.9 fL (ref 80.0–100.0)
MONO ABS: 0.3 10*3/uL (ref 0.2–1.0)
MONOS PCT: 19 %
NEUTROS ABS: 0.5 10*3/uL — AB (ref 1.4–6.5)
NEUTROS PCT: 28 %
Platelets: 34 10*3/uL — ABNORMAL LOW (ref 150–440)
RBC: 3.24 MIL/uL — ABNORMAL LOW (ref 4.40–5.90)
RDW: 16.8 % — AB (ref 11.5–14.5)
WBC: 1.7 10*3/uL — ABNORMAL LOW (ref 3.8–10.6)

## 2016-03-26 LAB — GLUCOSE, CAPILLARY
Glucose-Capillary: 59 mg/dL — ABNORMAL LOW (ref 65–99)
Glucose-Capillary: 62 mg/dL — ABNORMAL LOW (ref 65–99)
Glucose-Capillary: 68 mg/dL (ref 65–99)
Glucose-Capillary: 99 mg/dL (ref 65–99)

## 2016-03-26 LAB — TROPONIN I: Troponin I: <0.03 ng/mL (ref <0.03)

## 2016-03-26 MED ORDER — SODIUM CHLORIDE 0.9 % IV SOLN
1000.0000 mL | Freq: Once | INTRAVENOUS | Status: AC
  Administered 2016-03-26: 1000 mL via INTRAVENOUS

## 2016-03-26 MED ORDER — ONDANSETRON HCL 4 MG/2ML IJ SOLN: 4.0000 mg | Freq: Four times a day (QID) | INTRAMUSCULAR | Status: DC | PRN

## 2016-03-26 MED ORDER — ACETAMINOPHEN 325 MG PO TABS: 650.0000 mg | ORAL_TABLET | Freq: Four times a day (QID) | ORAL | Status: DC | PRN

## 2016-03-26 MED ORDER — FLUTICASONE PROPIONATE 50 MCG/ACT NA SUSP
2.0000 | Freq: Every day | NASAL | Status: DC | PRN
  Filled 2016-03-26: qty 16

## 2016-03-26 MED ORDER — INSULIN ASPART 100 UNIT/ML ~~LOC~~ SOLN: 0.0000 [IU] | Freq: Every day | SUBCUTANEOUS | Status: DC

## 2016-03-26 MED ORDER — INSULIN ASPART 100 UNIT/ML ~~LOC~~ SOLN: 6.0000 [IU] | Freq: Three times a day (TID) | SUBCUTANEOUS | Status: DC

## 2016-03-26 MED ORDER — ACETAMINOPHEN 650 MG RE SUPP: 650.0000 mg | Freq: Four times a day (QID) | RECTAL | Status: DC | PRN

## 2016-03-26 MED ORDER — NORTRIPTYLINE HCL 10 MG PO CAPS
10.0000 mg | ORAL_CAPSULE | Freq: Every day | ORAL | Status: DC
  Administered 2016-03-26: 10 mg via ORAL
  Filled 2016-03-26: qty 1

## 2016-03-26 MED ORDER — ORAL CARE MOUTH RINSE
15.0000 mL | Freq: Two times a day (BID) | OROMUCOSAL | Status: DC
Start: 2016-03-26 — End: 2016-03-27
  Administered 2016-03-26: 15 mL via OROMUCOSAL

## 2016-03-26 MED ORDER — HYDROCODONE-ACETAMINOPHEN 5-325 MG PO TABS: 1.0000 | ORAL_TABLET | ORAL | Status: DC | PRN

## 2016-03-26 MED ORDER — AMIODARONE HCL 200 MG PO TABS
200.0000 mg | ORAL_TABLET | Freq: Two times a day (BID) | ORAL | Status: DC
  Administered 2016-03-27: 200 mg via ORAL
  Filled 2016-03-26 (×2): qty 1

## 2016-03-26 MED ORDER — ONDANSETRON HCL 4 MG PO TABS: 4.0000 mg | ORAL_TABLET | Freq: Four times a day (QID) | ORAL | Status: DC | PRN

## 2016-03-26 MED ORDER — INSULIN ASPART 100 UNIT/ML ~~LOC~~ SOLN: 0.0000 [IU] | Freq: Three times a day (TID) | SUBCUTANEOUS | Status: DC

## 2016-03-26 MED ORDER — ROSUVASTATIN CALCIUM 10 MG PO TABS
10.0000 mg | ORAL_TABLET | Freq: Every evening | ORAL | Status: DC
  Administered 2016-03-26: 10 mg via ORAL
  Filled 2016-03-26: qty 1

## 2016-03-26 MED ORDER — POTASSIUM CHLORIDE IN NACL 20-0.9 MEQ/L-% IV SOLN
INTRAVENOUS | Status: DC
  Administered 2016-03-26 – 2016-03-27 (×2): via INTRAVENOUS
  Filled 2016-03-26 (×3): qty 1000

## 2016-03-26 MED ORDER — INSULIN GLARGINE 100 UNIT/ML ~~LOC~~ SOLN
14.0000 [IU] | Freq: Every evening | SUBCUTANEOUS | Status: DC
  Administered 2016-03-26: 14 [IU] via SUBCUTANEOUS
  Filled 2016-03-26 (×2): qty 0.14

## 2016-03-26 NOTE — H&P (Signed)
[place] [facility] [place] NAME: David Archer    MR#:  [id number]  DATE OF BIRTH:  [birth year]  DATE OF ADMISSION:  03/26/2016  PRIMARY CARE PHYSICIAN: Sherrin Daisy, MD   REQUESTING/REFERRING PHYSICIAN:   CHIEF COMPLAINT:  diarrhea  HISTORY OF PRESENT ILLNESS:  David Archer  is a 65 y.o. 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 Denies any nausea vomiting or abdominal pain. Patient came into the emergency department, his white blood cell count is low and platelets are low. Denies any fever but feeling tired  PAST MEDICAL HISTORY:   Past Medical History:  Diagnosis Date  . Arthritis   . Brain tumor ([place]) 8-9/17   unsure if cancerous at this time  . Diabetes mellitus without complication (HCC)    borderline  . Dysrhythmia    tachycardia  . GERD (gastroesophageal reflux disease)    takes OTC MEDS  . Headache   . Kidney stones    cant remember when  . Pre-diabetes   . Sleep apnea    back in 2000.  Hasn't been retested  . Tachycardia     PAST SURGICAL HISTOIRY:   Past Surgical History:  Procedure Laterality Date  . APPLICATION OF CRANIAL NAVIGATION N/A 08/17/2015   Procedure: APPLICATION OF CRANIAL NAVIGATION;  Surgeon: Newman Pies, MD;  Location: [place] NEURO ORS;  Service: Neurosurgery;  Laterality: N/A;  . APPLICATION OF CRANIAL NAVIGATION N/A 12/16/2015   Procedure: APPLICATION OF CRANIAL NAVIGATION;  Surgeon: Newman Pies, MD;  Location: [place] NEURO ORS;  Service: Neurosurgery;  Laterality: N/A;  . COLONOSCOPY    . CRANIOTOMY Left 12/16/2015   Procedure: CRANIOTOMY LEFT FRONTAL;  Surgeon: Newman Pies, MD;  Location: [place] NEURO ORS;  Service: Neurosurgery;  Laterality: Left;   . HAS NEVER HAD     HAS NEVER HAD ANESTHESIA--NO SURGERIES  . PR DURAL GRAFT REPAIR,SPINE DEFECT Left 08/17/2015   Procedure: STEREOTACTIC BRAIN BIOPSY WITH [place] [facility] NAVIGATION;  Surgeon: Newman Pies, MD;  Location: [place] NEURO ORS;  Service: Neurosurgery;  Laterality: Left;  Stereotactic brain biopsy with brainlab    SOCIAL HISTORY:   Social History  Substance Use Topics  . Smoking status: Former Smoker    Packs/day: 1.00    Years: 70.00    Types: Cigarettes    Quit date: 12/08/2008  . Smokeless tobacco: Former Systems developer    Quit date: 08/13/2008  . Alcohol use No     Comment: SWITCHES BETWEEN BEVERAGES-- GOES IN SPELLS    FAMILY HISTORY:  htn  DRUG ALLERGIES:  No Known Allergies  REVIEW OF SYSTEMS:  CONSTITUTIONAL: No fever, fatigue or weakness.  EYES: No blurred or double vision.  EARS, NOSE, AND THROAT: No tinnitus or ear pain.  RESPIRATORY: No cough, shortness of breath, wheezing or hemoptysis.  CARDIOVASCULAR: No chest pain, orthopnea, edema.  GASTROINTESTINAL: No nausea, vomiting, reporting  diarrhea or abdominal pain.  GENITOURINARY: No dysuria, hematuria.  ENDOCRINE: No polyuria, nocturia,  HEMATOLOGY: No anemia, easy bruising or bleeding SKIN: No rash or lesion. MUSCULOSKELETAL: No joint pain or arthritis.   NEUROLOGIC: No tingling, numbness, weakness.  PSYCHIATRY: No anxiety or depression.   MEDICATIONS AT HOME:   Prior to Admission medications   Medication Sig Start Date End Date  Taking? Authorizing Provider  amiodarone (PACERONE) 200 MG tablet Take 1 tablet (200 mg total) by mouth 2 (two) times daily. Patient taking differently: Take 200 mg by mouth daily.  01/08/16  Yes Dustin Flock, MD  fluticasone (FLONASE) 50 MCG/ACT nasal spray Place 2 sprays into both nostrils daily as needed for allergies or rhinitis.   Yes Historical Provider, MD  HYDROcodone-acetaminophen (NORCO/VICODIN) 5-325 MG tablet Take 1-2 tablets by mouth every 4 (four) hours as needed for  moderate pain. 12/19/15  Yes Earnie Larsson, MD  insulin aspart (NOVOLOG) 100 UNIT/ML injection Inject 6 Units into the skin 3 (three) times daily before meals.   Yes Historical Provider, MD  insulin glargine (LANTUS) 100 UNIT/ML injection Inject 0.14 mLs (14 Units total) into the skin daily. Patient taking differently: Inject 12 Units into the skin at bedtime.  12/31/15  Yes Dustin Flock, MD  metoprolol succinate (TOPROL-XL) 100 MG 24 hr tablet Take 1 tablet (100 mg total) by mouth daily. Take with or immediately following a meal. 08/20/15  Yes Newman Pies, MD  nortriptyline (PAMELOR) 10 MG capsule Take 10 mg by mouth at bedtime.    Yes Historical Provider, MD  rosuvastatin (CRESTOR) 10 MG tablet Take 10 mg by mouth daily.   Yes Historical Provider, MD      VITAL SIGNS:  Blood pressure 118/68, pulse 89, temperature 97.9 F (36.6 C), temperature source Oral, resp. rate 19, height '5\' 11"'$  (1.803 m), weight 80.3 kg (177 lb), SpO2 100 %.  PHYSICAL EXAMINATION:  GENERAL:  65 y.o.-year-old patient lying in the bed with no acute distress.  EYES: Pupils equal, round, reactive to light and accommodation. No scleral icterus. Extraocular muscles intact.  HEENT: Head atraumatic, normocephalic. Oropharynx and nasopharynx clear. Dry mucous membranes NECK:  Supple, no jugular venous distention. No thyroid enlargement, no tenderness.  LUNGS: Moderate breath sounds bilaterally, no wheezing, rales,rhonchi or crepitation. No use of accessory muscles of respiration.  CARDIOVASCULAR: S1, S2 normal. No murmurs, rubs, or gallops.  ABDOMEN: Soft, nontender, nondistended. Bowel sounds present. No organomegaly or mass.  EXTREMITIES: No pedal edema, cyanosis, or clubbing.  NEUROLOGIC: Cranial nerves II through XII are intact. Muscle strength 5/5 in all extremities. Sensation intact. Gait not checked.  PSYCHIATRIC: The patient is alert and oriented x 3.  SKIN: No obvious rash, lesion, or ulcer.   LABORATORY PANEL:    CBC  Recent Labs Lab 03/26/16 1443  WBC 1.7*  HGB 11.1*  HCT 32.0*  PLT 34*   ------------------------------------------------------------------------------------------------------------------  Chemistries   Recent Labs Lab 03/26/16 1443  NA 133*  K 3.1*  CL 94*  CO2 25  GLUCOSE 99  BUN 22*  CREATININE 0.86  CALCIUM 9.1  AST 25  ALT 21  ALKPHOS 61  BILITOT 1.0   ------------------------------------------------------------------------------------------------------------------  Cardiac Enzymes  Recent Labs Lab 03/26/16 1443  TROPONINI <0.03   ------------------------------------------------------------------------------------------------------------------  RADIOLOGY:  No results found.  EKG:   Orders placed or performed during the hospital encounter of 03/26/16  . ED EKG  . ED EKG    IMPRESSION AND PLAN:   David Archer  is a 65 y.o. 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 Denies any nausea vomiting   #Acute gastroenteritis-chemotherapy-induced Admit to MedSurg unit Provide hydration with IV fluids Monitor  renal function and  lites Check stool culture and sensitivity and C. difficile and occult blood  #Pancytopenia chemotherapy-induced Neutropenic but no fever, neutropenic precautions Monitor blood counts closely  Oncology consult if needed  #History of small cell lung cancer with brain metastases Status post surgery and radiation currently on chemotherapy Follow-up with [facility] oncology as recommended  #Insulin dependent diabetes mellitus Check hemoglobin A1c a.m., carb controlled diet Continue home medication Lantus 14  Units daily and sliding scale insulin  #Hypokalemia Potassium and check  electrolytes  #Hypertension blood pressure is low normal hold metoprolol  #Chronic atrial fibrillation with mild RVR Continue home medication amiodarone and hold metoprolol in view of low blood pressures will provide IV Lopressor boluses as needed   DVT prophylaxis with SCDs as patient is thrombocytopenic  All the records are reviewed and case discussed with ED provider. Management plans discussed with the patient, family and they are in agreement.  CODE STATUS: fc,wife   TOTAL TIME TAKING CARE OF THIS PATIENT: 45 minutes.   Note: This dictation was prepared with Dragon dictation along with smaller phrase technology. Any transcriptional errors that result from this process are unintentional.  Nicholes Mango M.D on 03/26/2016 at 6:31 PM  Between 7am to 6pm - Pager - [phone]  After 6pm go to [email or web address] - password [email or web address] [facility]  [facility]  Office  [phone]  CC: Primary care physician; Sherrin Daisy, MD

## 2016-03-26 NOTE — ED Triage Notes (Signed)
Patient brought in by [facility] from home for weakness and diarrhea for 1 week. Patient has hx/o brain tumor, patient last took chemo on 11/14, 15, and 16th. Patient denies N/V or fever.

## 2016-03-26 NOTE — Progress Notes (Signed)
Pt blood sugar 59 on admission, given orange juice x3, CBG 99, given peanut butter and crackers. MD notified and lantus given per MD verbal order. Will continue to monitor.

## 2016-03-26 NOTE — ED Notes (Signed)
Patient has ready bed, 209. Will call report and have patient transported to floor.

## 2016-03-26 NOTE — ED Provider Notes (Signed)
[facility] Emergency Department Provider Note   ____________________________________________    I have reviewed the triage vital signs and the nursing notes.   HISTORY  Chief Complaint Fatigue and Diarrhea     HPI David Archer is a 65 y.o. male who presents with fatigue and diarrhea. Patient has a history of lung cancer with metastasis to the brain. He is treated at [facility], received a second round of chemotherapy on November 14 15th and 16th. Since that time he has been having significant diarrhea and progressive worsening weakness. He denies fevers or chills. He denies abdominal pain. He reports diffuse weakness and fatigue. Wife is unable to care for him in this state   Past Medical History:  Diagnosis Date  . Arthritis   . Brain tumor ([place]) 8-9/17   unsure if cancerous at this time  . Diabetes mellitus without complication (HCC)    borderline  . Dysrhythmia    tachycardia  . GERD (gastroesophageal reflux disease)    takes OTC MEDS  . Headache   . Kidney stones    cant remember when  . Pre-diabetes   . Sleep apnea    back in 2000.  Hasn't been retested  . Tachycardia     Patient Active Problem List   Diagnosis Date Noted  . Atrial fibrillation with RVR ([place]) 12/29/2015  . Atrial fibrillation ([place]) 08/18/2015  . Sleep apnea   . Diabetes mellitus without complication ([place])   . S/P craniotomy 08/17/2015  . Brain tumor ([place]) 08/17/2015    Past Surgical History:  Procedure Laterality Date  . APPLICATION OF CRANIAL NAVIGATION N/A 08/17/2015   Procedure: APPLICATION OF CRANIAL NAVIGATION;  Surgeon: Newman Pies, MD;  Location: [place] NEURO ORS;  Service: Neurosurgery;  Laterality: N/A;  . APPLICATION OF CRANIAL NAVIGATION N/A 12/16/2015   Procedure: APPLICATION OF CRANIAL NAVIGATION;  Surgeon: Newman Pies, MD;  Location: [place] NEURO ORS;  Service: Neurosurgery;  Laterality: N/A;  . COLONOSCOPY    . CRANIOTOMY Left 12/16/2015   Procedure: CRANIOTOMY LEFT FRONTAL;  Surgeon: Newman Pies, MD;  Location: [place] NEURO ORS;  Service: Neurosurgery;  Laterality: Left;  . HAS NEVER HAD     HAS NEVER HAD ANESTHESIA--NO SURGERIES  . PR DURAL GRAFT REPAIR,SPINE DEFECT Left 08/17/2015   Procedure: STEREOTACTIC BRAIN BIOPSY WITH [place]athke NAVIGATION;  Surgeon: Newman Pies, MD;  Location: [place] NEURO ORS;  Service: Neurosurgery;  Laterality: Left;  Stereotactic brain biopsy with brainlab    Prior to Admission medications   Medication Sig Start Date End Date Taking? Authorizing Provider  amiodarone (PACERONE) 200 MG tablet Take 1 tablet (200 mg total) by mouth 2 (two) times daily. Patient taking differently: Take 200 mg by mouth daily.  01/08/16  Yes Dustin Flock, MD  fluticasone (FLONASE) 50 MCG/ACT nasal spray Place 2 sprays into both nostrils daily as needed for allergies or rhinitis.   Yes Historical Provider, MD  HYDROcodone-acetaminophen (NORCO/VICODIN) 5-325 MG tablet Take 1-2 tablets by mouth every 4 (four) hours as needed for moderate pain. 12/19/15  Yes Earnie Larsson, MD  insulin aspart (NOVOLOG) 100 UNIT/ML injection Inject 6 Units into the skin 3 (three) times daily before meals.   Yes Historical Provider, MD  insulin glargine (LANTUS) 100 UNIT/ML injection Inject 0.14 mLs (14 Units total) into the skin daily. Patient taking differently: Inject 12 Units into the skin at bedtime.  12/31/15  Yes Dustin Flock, MD  metoprolol succinate (TOPROL-XL) 100 MG 24 hr tablet Take 1 tablet (100 mg  total) by mouth daily. Take with or immediately following a meal. 08/20/15  Yes Newman Pies, MD  nortriptyline (PAMELOR) 10 MG capsule Take 10 mg by mouth at bedtime.    Yes Historical Provider, MD  rosuvastatin (CRESTOR) 10 MG tablet Take 10 mg by mouth daily.   Yes Historical Provider, MD     Allergies Patient has no known allergies.  No family history on file.  Social History Social History  Substance Use Topics  . Smoking status:  Former Smoker    Packs/day: 1.00    Years: 70.00    Types: Cigarettes    Quit date: 12/08/2008  . Smokeless tobacco: Former Systems developer    Quit date: 08/13/2008  . Alcohol use No     Comment: SWITCHES BETWEEN BEVERAGES-- GOES IN SPELLS    Review of Systems  Constitutional: No fever/chills   Cardiovascular: Denies chest pain. Respiratory: Denies shortness of breath. Gastrointestinal: No abdominal pain.  Diarrhea as above, nonbloody Genitourinary: Negative for dysuria. Musculoskeletal: Negative for back pain. Skin: Negative for rash. Neurological: Negative for headaches   10-point ROS otherwise negative.  ____________________________________________   PHYSICAL EXAM:  VITAL SIGNS: ED Triage Vitals  Enc Vitals Group     BP 03/26/16 1500 101/79     Pulse Rate 03/26/16 1500 (!) 101     Resp 03/26/16 1500 (!) 24     Temp --      Temp src --      SpO2 03/26/16 1500 100 %     Weight 03/26/16 1424 177 lb (80.3 kg)     Height 03/26/16 1424 '5\' 11"'$  (1.803 m)     Head Circumference --      Peak Flow --      Pain Score --      Pain Loc --      Pain Edu? --      Excl. in [place]? --     Constitutional: Alert and oriented. No acute distress. Pleasant and interactive Eyes: Conjunctivae are normal.   Nose: No congestion/rhinnorhea. Mouth/Throat: Mucous membranes are Dry  Cardiovascular: Normal rate, regular rhythm. Grossly normal heart sounds.  Good peripheral circulation. Respiratory: Normal respiratory effort.  No retractions. Lungs CTAB. Gastrointestinal: Soft and nontender. No distention.  No CVA tenderness. Genitourinary: deferred Musculoskeletal: No lower extremity tenderness nor edema.  Warm and well perfused Neurologic:  Normal speech and language. No gross focal neurologic deficits are appreciated.  Skin:  Skin is warm, dry and intact. No rash noted. Psychiatric: Mood and affect are normal. Speech and behavior are normal.  ____________________________________________    LABS (all labs ordered are listed, but only abnormal results are displayed)  Labs Reviewed  CBC WITH DIFFERENTIAL/PLATELET - Abnormal; Notable for the following:       Result Value   WBC 1.7 (*)    RBC 3.24 (*)    Hemoglobin 11.1 (*)    HCT 32.0 (*)    MCH 34.3 (*)    RDW 16.8 (*)    Platelets 34 (*)    Neutro Abs 0.5 (*)    Lymphs Abs 0.9 (*)    All other components within normal limits  COMPREHENSIVE METABOLIC PANEL - Abnormal; Notable for the following:    Sodium 133 (*)    Potassium 3.1 (*)    Chloride 94 (*)    BUN 22 (*)    All other components within normal limits  TROPONIN I   ____________________________________________  EKG  ED ECG REPORT I, Lavonia Drafts, the attending physician,  personally viewed and interpreted this ECG.  Date: 03/26/2016 EKG Time: 2:54 PM Rate: 102 Rhythm: Sinus tachycardia QRS Axis: Right axis deviation Intervals: normal ST/T Wave abnormalities: normal Conduction Disturbances: none   ____________________________________________  RADIOLOGY  None ____________________________________________   PROCEDURES  Procedure(s) performed: No    Critical Care performed: No ____________________________________________   INITIAL IMPRESSION / ASSESSMENT AND PLAN / ED COURSE  Pertinent labs & imaging results that were available during my care of the patient were reviewed by me and considered in my medical decision making (see chart for details).  Patient presents with fatigue/weakness and diarrhea. This is all likely related to chemotherapy. He is neutropenic in the emergency department however he is afebrile and well-appearing. IV fluids started in the emergency department I will admit to the hospitalist service for further rehydration and evaluation.  Clinical Course    ____________________________________________   FINAL CLINICAL IMPRESSION(S) / ED DIAGNOSES  Final diagnoses:  Dehydration  Fatigue, unspecified type   Diarrhea, unspecified type      NEW MEDICATIONS STARTED DURING THIS VISIT:  New Prescriptions   No medications on file     Note:  This document was prepared using Dragon voice recognition software and may include unintentional dictation errors.    Lavonia Drafts, MD 03/26/16 [phone]

## 2016-03-27 LAB — COMPREHENSIVE METABOLIC PANEL
ALBUMIN: 2.6 g/dL — AB (ref 3.5–5.0)
ALK PHOS: 49 U/L (ref 38–126)
ALT: 20 U/L (ref 17–63)
ANION GAP: 9 (ref 5–15)
AST: 22 U/L (ref 15–41)
BUN: 13 mg/dL (ref 6–20)
CO2: 24 mmol/L (ref 22–32)
Calcium: 8.2 mg/dL — ABNORMAL LOW (ref 8.9–10.3)
Chloride: 105 mmol/L (ref 101–111)
Creatinine, Ser: 0.63 mg/dL (ref 0.61–1.24)
GFR calc Af Amer: >60 mL/min (ref >60)
GFR calc non Af Amer: >60 mL/min (ref >60)
GLUCOSE: 77 mg/dL (ref 65–99)
POTASSIUM: 2.9 mmol/L — AB (ref 3.5–5.1)
SODIUM: 138 mmol/L (ref 135–145)
Total Bilirubin: 0.7 mg/dL (ref 0.3–1.2)
Total Protein: 5.8 g/dL — ABNORMAL LOW (ref 6.5–8.1)

## 2016-03-27 LAB — C DIFFICILE QUICK SCREEN W PCR REFLEX
C Diff antigen: NEGATIVE
C Diff interpretation: NOT DETECTED
C Diff toxin: NEGATIVE

## 2016-03-27 LAB — GLUCOSE, CAPILLARY
GLUCOSE-CAPILLARY: 77 mg/dL (ref 65–99)
GLUCOSE-CAPILLARY: 110 mg/dL — AB (ref 65–99)
Glucose-Capillary: 66 mg/dL (ref 65–99)

## 2016-03-27 LAB — CBC
HEMATOCRIT: 20.6 % — AB (ref 40.0–52.0)
HEMOGLOBIN: 7.1 g/dL — AB (ref 13.0–18.0)
MCH: 34.1 pg — AB (ref 26.0–34.0)
MCHC: 34.3 g/dL (ref 32.0–36.0)
MCV: 99.4 fL (ref 80.0–100.0)
Platelets: 26 10*3/uL — CL (ref 150–440)
RBC: 2.08 MIL/uL — ABNORMAL LOW (ref 4.40–5.90)
RDW: 16.8 % — ABNORMAL HIGH (ref 11.5–14.5)
WBC: 2.7 10*3/uL — ABNORMAL LOW (ref 3.8–10.6)

## 2016-03-27 LAB — OCCULT BLOOD X 1 CARD TO LAB, STOOL: Fecal Occult Bld: NEGATIVE

## 2016-03-27 MED ORDER — INSULIN GLARGINE 100 UNIT/ML ~~LOC~~ SOLN: 10.0000 [IU] | Freq: Every day | SUBCUTANEOUS | 0 refills | Status: AC

## 2016-03-27 MED ORDER — AMIODARONE HCL 200 MG PO TABS: 200.0000 mg | ORAL_TABLET | Freq: Every day | ORAL | 0 refills | Status: AC

## 2016-03-27 MED ORDER — INSULIN GLARGINE 100 UNIT/ML ~~LOC~~ SOLN: 12.0000 [IU] | Freq: Every day | SUBCUTANEOUS | 0 refills | Status: DC

## 2016-03-27 NOTE — Progress Notes (Signed)
[facility] responded to an OR for an AD. Pt stated that he already has a living will and [facility] POA. Pt declined education or materials.    03/27/16 1000  Clinical Encounter Type  Visited With Patient  Visit Type Initial;Spiritual support  Referral From Nurse  Spiritual Encounters  Spiritual Needs Literature

## 2016-03-27 NOTE — Discharge Summary (Signed)
[facility] [place] NAME: David Archer    MR#:  [id number]  DATE OF BIRTH:  1951-04-29  DATE OF ADMISSION:  03/26/2016 ADMITTING PHYSICIAN: Nicholes Mango, MD  DATE OF DISCHARGE: 03/27/2016  PRIMARY CARE PHYSICIAN: Sherrin Daisy, MD    ADMISSION DIAGNOSIS:  Dehydration [E86.0] Fatigue, unspecified type [R53.83] Diarrhea, unspecified type [R19.7]  DISCHARGE DIAGNOSIS:  Active Problems:   Acute gastroenteritis   SECONDARY DIAGNOSIS:   Past Medical History:  Diagnosis Date  . Arthritis   . Brain tumor ([place]) 8-9/17   unsure if cancerous at this time  . Diabetes mellitus without complication (HCC)    borderline  . Dysrhythmia    tachycardia  . GERD (gastroesophageal reflux disease)    takes OTC MEDS  . Headache   . Kidney stones    cant remember when  . Pre-diabetes   . Sleep apnea    back in 2000.  Hasn't been retested  . Tachycardia     HOSPITAL COURSE:    65 y.o. 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  1. Diarrhea  From chemotherapy. Resolved C diff was negative.  2. SCLC: Patient has outpatient follow up with his ONCOLOGIST at [facility]  3. Pancytopenia from Chemotherapy. He has follow up this week for lab work.  4. IDDM: His blood sugar was low, due to poor po intake from chemotherapy and cancer. Insulin dose has been decreased and hypoglycemia symptoms were reviewed.  5. HTN essential: he may continue Metoptolol  6. PAF: continue Metoprolol for HR control and AMIODARONE. 7. Hypokalemia: repleted   DISCHARGE CONDITIONS AND DIET:   Stable cardiac /diabetic diet  CONSULTS OBTAINED:    DRUG ALLERGIES:  No Known Allergies  DISCHARGE MEDICATIONS:   Current Discharge Medication List    CONTINUE these medications which have CHANGED   Details   amiodarone (PACERONE) 200 MG tablet Take 1 tablet (200 mg total) by mouth daily. Qty: 30 tablet, Refills: 0    insulin glargine (LANTUS) 100 UNIT/ML injection Inject 0.1 mLs (10 Units total) into the skin at bedtime. Qty: 12 mL, Refills: 0      CONTINUE these medications which have NOT CHANGED   Details  fluticasone (FLONASE) 50 MCG/ACT nasal spray Place 2 sprays into both nostrils daily as needed for allergies or rhinitis.    HYDROcodone-acetaminophen (NORCO/VICODIN) 5-325 MG tablet Take 1-2 tablets by mouth every 4 (four) hours as needed for moderate pain. Qty: 60 tablet, Refills: 0    insulin aspart (NOVOLOG) 100 UNIT/ML injection Inject 6 Units into the skin 3 (three) times daily before meals.    metoprolol succinate (TOPROL-XL) 100 MG 24 hr tablet Take 1 tablet (100 mg total) by mouth daily. Take with or immediately following a meal. Qty: 30 tablet, Refills: 1    nortriptyline (PAMELOR) 10 MG capsule Take 10 mg by mouth at bedtime.     rosuvastatin (CRESTOR) 10 MG tablet Take 10 mg by mouth daily.              Today   CHIEF COMPLAINT:  Doing better this am  Diarrhea improved no abdominal pain   VITAL SIGNS:  Blood pressure (!) 98/53, pulse 88, temperature 98.1 F (36.7 C), resp. rate 16, height '5\' 11"'$  (1.803 m), weight 77.5 kg (170 lb 14.4 oz), SpO2 100 %.  REVIEW OF SYSTEMS:  Review of Systems  Constitutional: Negative.  Negative for chills, fever and malaise/fatigue.  HENT: Negative.  Negative for ear discharge, ear pain, hearing loss, nosebleeds and sore throat.   Eyes: Negative.  Negative for blurred vision and pain.  Respiratory: Negative.  Negative for cough, hemoptysis, shortness of breath and wheezing.   Cardiovascular: Negative.  Negative for chest pain, palpitations and leg swelling.  Gastrointestinal: Positive for diarrhea (resolved). Negative for abdominal pain, blood in stool, nausea and vomiting.  Genitourinary: Negative.  Negative for  dysuria.  Musculoskeletal: Negative.  Negative for back pain.  Skin: Negative.   Neurological: Negative for dizziness, tremors, speech change, focal weakness, seizures and headaches.  Endo/Heme/Allergies: Negative.  Does not bruise/bleed easily.  Psychiatric/Behavioral: Negative.  Negative for depression, hallucinations and suicidal ideas.     PHYSICAL EXAMINATION:  GENERAL:  65 y.o.-year-old patient lying in the bed with no acute distress.  NECK:  Supple, no jugular venous distention. No thyroid enlargement, no tenderness.  LUNGS: Normal breath sounds bilaterally, no wheezing, rales,rhonchi  No use of accessory muscles of respiration.  CARDIOVASCULAR: S1, S2 normal. No murmurs, rubs, or gallops.  ABDOMEN: Soft, non-tender, non-distended. Bowel sounds present. No organomegaly or mass.  EXTREMITIES: No pedal edema, cyanosis, or clubbing.  PSYCHIATRIC: The patient is alert and oriented x 3.  SKIN: No obvious rash, lesion, or ulcer.   DATA REVIEW:   CBC  Recent Labs Lab 03/27/16 0703  WBC 2.7*  HGB 7.1*  HCT 20.6*  PLT 26*    Chemistries   Recent Labs Lab 03/27/16 0703  NA 138  K 2.9*  CL 105  CO2 24  GLUCOSE 77  BUN 13  CREATININE 0.63  CALCIUM 8.2*  AST 22  ALT 20  ALKPHOS 49  BILITOT 0.7    Cardiac Enzymes  Recent Labs Lab 03/26/16 1443  TROPONINI <0.03    Microbiology Results  '@[id number]'$ @  RADIOLOGY:  No results found.    Management plans discussed with the patient and he is in agreement. Stable for discharge home  Patient should follow up with pcp  CODE STATUS:     Code Status Orders        Start     Ordered   03/26/16 2007  Full code  Continuous     03/26/16 2006    Code Status History    Date Active Date Inactive Code Status Order ID Comments User Context   12/29/2015  1:21 AM 12/31/2015  4:03 PM Full Code [id number]  Lance Coon, MD Inpatient   12/16/2015  7:14 PM 12/19/2015  5:07 PM Full Code [id number]  Newman Pies, MD  Inpatient    Advance Directive Documentation   Flowsheet Row Most Recent Value  Type of Advance Directive  Healthcare Power of Attorney, Living will  Pre-existing out of facility DNR order (yellow form or pink MOST form)  No data  "MOST" Form in Place?  No data      TOTAL TIME TAKING CARE OF THIS PATIENT: 36 minutes.    Note: This dictation was prepared with Dragon dictation along with smaller phrase technology. Any transcriptional errors that result from this process are unintentional.  Rickeya Manus M.D on 03/27/2016 at 9:13 AM  Between 7am to 6pm - Pager - [phone] After 6pm go to [email or web address] - password [email or web address] [facility]  Office  [phone]  CC: Primary care physician; Sherrin Daisy, MD

## 2016-03-27 NOTE — Progress Notes (Signed)
Patient discharged to home. Concerns addressed. Iv site removed.

## 2016-03-30 ENCOUNTER — Inpatient Hospital Stay
Admission: EM | Admit: 2016-03-30 | Discharge: 2016-04-03 | DRG: 347 | Disposition: A | Discharge status: Skilled Nursing Facility | Payer: BLUE CROSS/BLUE SHIELD | Attending: Internal Medicine | Admitting: Internal Medicine

## 2016-03-30 DIAGNOSIS — K610 Anal abscess: Principal | ICD-10-CM

## 2016-03-30 DIAGNOSIS — I959 Hypotension, unspecified: Secondary | ICD-10-CM | POA: Diagnosis present

## 2016-03-30 DIAGNOSIS — E43 Unspecified severe protein-calorie malnutrition: Secondary | ICD-10-CM | POA: Insufficient documentation

## 2016-03-30 DIAGNOSIS — Z9221 Personal history of antineoplastic chemotherapy: Secondary | ICD-10-CM

## 2016-03-30 DIAGNOSIS — K5900 Constipation, unspecified: Secondary | ICD-10-CM | POA: Diagnosis present

## 2016-03-30 DIAGNOSIS — R531 Weakness: Secondary | ICD-10-CM

## 2016-03-30 DIAGNOSIS — I4891 Unspecified atrial fibrillation: Secondary | ICD-10-CM | POA: Diagnosis present

## 2016-03-30 DIAGNOSIS — D899 Disorder involving the immune mechanism, unspecified: Secondary | ICD-10-CM | POA: Diagnosis present

## 2016-03-30 DIAGNOSIS — C3490 Malignant neoplasm of unspecified part of unspecified bronchus or lung: Secondary | ICD-10-CM | POA: Diagnosis present

## 2016-03-30 DIAGNOSIS — C7931 Secondary malignant neoplasm of brain: Secondary | ICD-10-CM | POA: Diagnosis present

## 2016-03-30 DIAGNOSIS — Z85118 Personal history of other malignant neoplasm of bronchus and lung: Secondary | ICD-10-CM

## 2016-03-30 DIAGNOSIS — I248 Other forms of acute ischemic heart disease: Secondary | ICD-10-CM | POA: Diagnosis present

## 2016-03-30 DIAGNOSIS — Z9889 Other specified postprocedural states: Secondary | ICD-10-CM

## 2016-03-30 DIAGNOSIS — R509 Fever, unspecified: Secondary | ICD-10-CM

## 2016-03-30 DIAGNOSIS — R000 Tachycardia, unspecified: Secondary | ICD-10-CM | POA: Diagnosis present

## 2016-03-30 DIAGNOSIS — Z6824 Body mass index (BMI) 24.0-24.9, adult: Secondary | ICD-10-CM

## 2016-03-30 DIAGNOSIS — E785 Hyperlipidemia, unspecified: Secondary | ICD-10-CM | POA: Diagnosis present

## 2016-03-30 DIAGNOSIS — Z87891 Personal history of nicotine dependence: Secondary | ICD-10-CM

## 2016-03-30 DIAGNOSIS — Z79899 Other long term (current) drug therapy: Secondary | ICD-10-CM

## 2016-03-30 DIAGNOSIS — R778 Other specified abnormalities of plasma proteins: Secondary | ICD-10-CM

## 2016-03-30 DIAGNOSIS — G4730 Sleep apnea, unspecified: Secondary | ICD-10-CM | POA: Diagnosis present

## 2016-03-30 DIAGNOSIS — E876 Hypokalemia: Secondary | ICD-10-CM

## 2016-03-30 DIAGNOSIS — Z7951 Long term (current) use of inhaled steroids: Secondary | ICD-10-CM

## 2016-03-30 DIAGNOSIS — R7989 Other specified abnormal findings of blood chemistry: Secondary | ICD-10-CM

## 2016-03-30 DIAGNOSIS — K219 Gastro-esophageal reflux disease without esophagitis: Secondary | ICD-10-CM | POA: Diagnosis present

## 2016-03-30 DIAGNOSIS — M1990 Unspecified osteoarthritis, unspecified site: Secondary | ICD-10-CM | POA: Diagnosis present

## 2016-03-30 DIAGNOSIS — Z87442 Personal history of urinary calculi: Secondary | ICD-10-CM

## 2016-03-30 DIAGNOSIS — Z923 Personal history of irradiation: Secondary | ICD-10-CM

## 2016-03-30 DIAGNOSIS — R262 Difficulty in walking, not elsewhere classified: Secondary | ICD-10-CM

## 2016-03-30 DIAGNOSIS — Z794 Long term (current) use of insulin: Secondary | ICD-10-CM

## 2016-03-30 DIAGNOSIS — E119 Type 2 diabetes mellitus without complications: Secondary | ICD-10-CM | POA: Diagnosis present

## 2016-03-30 DIAGNOSIS — A419 Sepsis, unspecified organism: Secondary | ICD-10-CM | POA: Diagnosis present

## 2016-03-30 NOTE — ED Triage Notes (Signed)
Pt with fever noted today at home, pt is currently getting chemo last treatment 11/16 for lung ca with mets to the brain.

## 2016-03-31 ENCOUNTER — Other Ambulatory Visit: Payer: BLUE CROSS/BLUE SHIELD

## 2016-03-31 ENCOUNTER — Emergency Department: Payer: BLUE CROSS/BLUE SHIELD

## 2016-03-31 DIAGNOSIS — Z85118 Personal history of other malignant neoplasm of bronchus and lung: Secondary | ICD-10-CM | POA: Diagnosis not present

## 2016-03-31 DIAGNOSIS — K219 Gastro-esophageal reflux disease without esophagitis: Secondary | ICD-10-CM | POA: Diagnosis present

## 2016-03-31 DIAGNOSIS — I959 Hypotension, unspecified: Secondary | ICD-10-CM | POA: Diagnosis present

## 2016-03-31 DIAGNOSIS — Z9221 Personal history of antineoplastic chemotherapy: Secondary | ICD-10-CM | POA: Diagnosis not present

## 2016-03-31 DIAGNOSIS — E785 Hyperlipidemia, unspecified: Secondary | ICD-10-CM | POA: Diagnosis present

## 2016-03-31 DIAGNOSIS — I248 Other forms of acute ischemic heart disease: Secondary | ICD-10-CM | POA: Diagnosis present

## 2016-03-31 DIAGNOSIS — C3490 Malignant neoplasm of unspecified part of unspecified bronchus or lung: Secondary | ICD-10-CM | POA: Diagnosis present

## 2016-03-31 DIAGNOSIS — I4891 Unspecified atrial fibrillation: Secondary | ICD-10-CM | POA: Diagnosis present

## 2016-03-31 DIAGNOSIS — A419 Sepsis, unspecified organism: Secondary | ICD-10-CM | POA: Diagnosis present

## 2016-03-31 DIAGNOSIS — Z794 Long term (current) use of insulin: Secondary | ICD-10-CM | POA: Diagnosis not present

## 2016-03-31 DIAGNOSIS — D899 Disorder involving the immune mechanism, unspecified: Secondary | ICD-10-CM | POA: Diagnosis present

## 2016-03-31 DIAGNOSIS — E119 Type 2 diabetes mellitus without complications: Secondary | ICD-10-CM | POA: Diagnosis present

## 2016-03-31 DIAGNOSIS — Z7951 Long term (current) use of inhaled steroids: Secondary | ICD-10-CM | POA: Diagnosis not present

## 2016-03-31 DIAGNOSIS — Z9889 Other specified postprocedural states: Secondary | ICD-10-CM | POA: Diagnosis not present

## 2016-03-31 DIAGNOSIS — R000 Tachycardia, unspecified: Secondary | ICD-10-CM | POA: Diagnosis present

## 2016-03-31 DIAGNOSIS — M1990 Unspecified osteoarthritis, unspecified site: Secondary | ICD-10-CM | POA: Diagnosis present

## 2016-03-31 DIAGNOSIS — C7931 Secondary malignant neoplasm of brain: Secondary | ICD-10-CM | POA: Diagnosis present

## 2016-03-31 DIAGNOSIS — G4730 Sleep apnea, unspecified: Secondary | ICD-10-CM | POA: Diagnosis present

## 2016-03-31 DIAGNOSIS — Z87442 Personal history of urinary calculi: Secondary | ICD-10-CM | POA: Diagnosis not present

## 2016-03-31 DIAGNOSIS — Z79899 Other long term (current) drug therapy: Secondary | ICD-10-CM | POA: Diagnosis not present

## 2016-03-31 DIAGNOSIS — Z87891 Personal history of nicotine dependence: Secondary | ICD-10-CM | POA: Diagnosis not present

## 2016-03-31 DIAGNOSIS — Z923 Personal history of irradiation: Secondary | ICD-10-CM | POA: Diagnosis not present

## 2016-03-31 DIAGNOSIS — E876 Hypokalemia: Secondary | ICD-10-CM | POA: Diagnosis present

## 2016-03-31 DIAGNOSIS — E43 Unspecified severe protein-calorie malnutrition: Secondary | ICD-10-CM | POA: Diagnosis present

## 2016-03-31 DIAGNOSIS — K610 Anal abscess: Secondary | ICD-10-CM | POA: Diagnosis present

## 2016-03-31 LAB — CBC WITH DIFFERENTIAL/PLATELET
BASOS ABS: 0 10*3/uL (ref 0–0.1)
BASOS PCT: 0 %
Band Neutrophils: 11 %
Blasts: 0 %
EOS PCT: 0 %
Eosinophils Absolute: 0 10*3/uL (ref 0–0.7)
HCT: 30 % — ABNORMAL LOW (ref 40.0–52.0)
Hemoglobin: 10.4 g/dL — ABNORMAL LOW (ref 13.0–18.0)
LYMPHS ABS: 3.5 10*3/uL (ref 1.0–3.6)
Lymphocytes Relative: 18 %
MCH: 34.3 pg — AB (ref 26.0–34.0)
MCHC: 34.7 g/dL (ref 32.0–36.0)
MCV: 98.9 fL (ref 80.0–100.0)
METAMYELOCYTES PCT: 2 %
MONO ABS: 1.8 10*3/uL — AB (ref 0.2–1.0)
MONOS PCT: 9 %
MYELOCYTES: 2 %
NEUTROS ABS: 14.2 10*3/uL — AB (ref 1.4–6.5)
NRBC: 2 /100{WBCs} — AB
Neutrophils Relative %: 58 %
Other: 0 %
PLATELETS: 86 10*3/uL — AB (ref 150–440)
Promyelocytes Absolute: 0 %
RBC: 3.03 MIL/uL — AB (ref 4.40–5.90)
RDW: 16.8 % — AB (ref 11.5–14.5)
WBC: 19.5 10*3/uL — ABNORMAL HIGH (ref 3.8–10.6)

## 2016-03-31 LAB — GLUCOSE, CAPILLARY
Glucose-Capillary: 94 mg/dL (ref 65–99)
Glucose-Capillary: 94 mg/dL (ref 65–99)
Glucose-Capillary: 108 mg/dL — ABNORMAL HIGH (ref 65–99)
Glucose-Capillary: 108 mg/dL — ABNORMAL HIGH (ref 65–99)

## 2016-03-31 LAB — TROPONIN I: TROPONIN I: 0.04 ng/mL — AB (ref <0.03)

## 2016-03-31 LAB — COMPREHENSIVE METABOLIC PANEL
ALT: 24 U/L (ref 17–63)
AST: 31 U/L (ref 15–41)
Albumin: 3.1 g/dL — ABNORMAL LOW (ref 3.5–5.0)
Alkaline Phosphatase: 81 U/L (ref 38–126)
Anion gap: 12 (ref 5–15)
BUN: 13 mg/dL (ref 6–20)
CHLORIDE: 98 mmol/L — AB (ref 101–111)
CO2: 25 mmol/L (ref 22–32)
CREATININE: 0.95 mg/dL (ref 0.61–1.24)
Calcium: 8.3 mg/dL — ABNORMAL LOW (ref 8.9–10.3)
Glucose, Bld: 110 mg/dL — ABNORMAL HIGH (ref 65–99)
POTASSIUM: 2.5 mmol/L — AB (ref 3.5–5.1)
SODIUM: 135 mmol/L (ref 135–145)
Total Bilirubin: 1 mg/dL (ref 0.3–1.2)
Total Protein: 6.5 g/dL (ref 6.5–8.1)

## 2016-03-31 LAB — URINALYSIS COMPLETE WITH MICROSCOPIC (ARMC ONLY)
Bacteria, UA: NONE SEEN
Bilirubin Urine: NEGATIVE
Glucose, UA: NEGATIVE mg/dL
Leukocytes, UA: NEGATIVE
Nitrite: NEGATIVE
Protein, ur: NEGATIVE mg/dL
Specific Gravity, Urine: 1.014 (ref 1.005–1.030)
pH: 6 (ref 5.0–8.0)

## 2016-03-31 LAB — BASIC METABOLIC PANEL
Anion gap: 9 (ref 5–15)
BUN: 11 mg/dL (ref 6–20)
CALCIUM: 8.1 mg/dL — AB (ref 8.9–10.3)
CO2: 27 mmol/L (ref 22–32)
CREATININE: 0.85 mg/dL (ref 0.61–1.24)
Chloride: 100 mmol/L — ABNORMAL LOW (ref 101–111)
GFR calc non Af Amer: >60 mL/min (ref >60)
GLUCOSE: 103 mg/dL — AB (ref 65–99)
Potassium: 3.2 mmol/L — ABNORMAL LOW (ref 3.5–5.1)
Sodium: 136 mmol/L (ref 135–145)

## 2016-03-31 LAB — LACTIC ACID, PLASMA
Lactic Acid, Venous: 1.3 mmol/L (ref 0.5–1.9)
Lactic Acid, Venous: 1.7 mmol/L (ref 0.5–1.9)

## 2016-03-31 LAB — TSH: TSH: 0.939 u[IU]/mL (ref 0.350–4.500)

## 2016-03-31 LAB — MAGNESIUM: Magnesium: 1.2 mg/dL — ABNORMAL LOW (ref 1.7–2.4)

## 2016-03-31 MED ORDER — HYDROCODONE-ACETAMINOPHEN 5-325 MG PO TABS
1.0000 | ORAL_TABLET | ORAL | Status: DC | PRN
  Administered 2016-04-01: 18:00:00 2 via ORAL
  Administered 2016-04-02: 1 via ORAL
  Filled 2016-03-31: qty 2
  Filled 2016-03-31: qty 1

## 2016-03-31 MED ORDER — ONDANSETRON HCL 4 MG PO TABS: 4.0000 mg | ORAL_TABLET | Freq: Four times a day (QID) | ORAL | Status: DC | PRN

## 2016-03-31 MED ORDER — DOCUSATE SODIUM 100 MG PO CAPS
100.0000 mg | ORAL_CAPSULE | Freq: Two times a day (BID) | ORAL | Status: DC
  Administered 2016-03-31 – 2016-04-02 (×5): 100 mg via ORAL
  Filled 2016-03-31 (×7): qty 1

## 2016-03-31 MED ORDER — VANCOMYCIN HCL 10 G IV SOLR
1250.0000 mg | Freq: Two times a day (BID) | INTRAVENOUS | Status: DC
  Administered 2016-03-31 (×2): 1250 mg via INTRAVENOUS
  Filled 2016-03-31 (×3): qty 1250

## 2016-03-31 MED ORDER — HEPARIN SODIUM (PORCINE) 5000 UNIT/ML IJ SOLN
5000.0000 [IU] | Freq: Three times a day (TID) | INTRAMUSCULAR | Status: DC
  Administered 2016-03-31 – 2016-04-03 (×9): 5000 [IU] via SUBCUTANEOUS
  Filled 2016-03-31 (×9): qty 1

## 2016-03-31 MED ORDER — ACETAMINOPHEN 650 MG RE SUPP: 650.0000 mg | Freq: Four times a day (QID) | RECTAL | Status: DC | PRN

## 2016-03-31 MED ORDER — NORTRIPTYLINE HCL 10 MG PO CAPS
10.0000 mg | ORAL_CAPSULE | Freq: Every day | ORAL | Status: DC
  Administered 2016-03-31 – 2016-04-02 (×3): 10 mg via ORAL
  Filled 2016-03-31 (×4): qty 1

## 2016-03-31 MED ORDER — POTASSIUM CHLORIDE CRYS ER 20 MEQ PO TBCR
60.0000 meq | EXTENDED_RELEASE_TABLET | Freq: Once | ORAL | Status: AC
  Administered 2016-03-31: 60 meq via ORAL
  Filled 2016-03-31: qty 3

## 2016-03-31 MED ORDER — INSULIN ASPART 100 UNIT/ML ~~LOC~~ SOLN
0.0000 [IU] | Freq: Three times a day (TID) | SUBCUTANEOUS | Status: DC
  Administered 2016-04-02 – 2016-04-03 (×2): 3 [IU] via SUBCUTANEOUS
  Filled 2016-03-31 (×2): qty 3

## 2016-03-31 MED ORDER — INSULIN GLARGINE 100 UNIT/ML ~~LOC~~ SOLN
6.0000 [IU] | Freq: Every day | SUBCUTANEOUS | Status: DC
Start: 2016-03-31 — End: 2016-04-01
  Administered 2016-03-31: 6 [IU] via SUBCUTANEOUS
  Filled 2016-03-31 (×2): qty 0.06

## 2016-03-31 MED ORDER — FLUTICASONE PROPIONATE 50 MCG/ACT NA SUSP
2.0000 | Freq: Every day | NASAL | Status: DC | PRN
  Filled 2016-03-31: qty 16

## 2016-03-31 MED ORDER — AMIODARONE HCL 200 MG PO TABS
200.0000 mg | ORAL_TABLET | Freq: Every day | ORAL | Status: DC
  Administered 2016-03-31 – 2016-04-03 (×3): 200 mg via ORAL
  Filled 2016-03-31 (×3): qty 1

## 2016-03-31 MED ORDER — VANCOMYCIN HCL IN DEXTROSE 1-5 GM/200ML-% IV SOLN
1000.0000 mg | Freq: Once | INTRAVENOUS | Status: AC
  Administered 2016-03-31: 1000 mg via INTRAVENOUS
  Filled 2016-03-31: qty 200

## 2016-03-31 MED ORDER — MAGNESIUM SULFATE 4 GM/100ML IV SOLN
4.0000 g | Freq: Once | INTRAVENOUS | Status: AC
  Administered 2016-03-31: 4 g via INTRAVENOUS
  Filled 2016-03-31 (×2): qty 100

## 2016-03-31 MED ORDER — SODIUM CHLORIDE 0.9% FLUSH
3.0000 mL | Freq: Two times a day (BID) | INTRAVENOUS | Status: DC
  Administered 2016-03-31 – 2016-04-03 (×6): 3 mL via INTRAVENOUS

## 2016-03-31 MED ORDER — ROSUVASTATIN CALCIUM 10 MG PO TABS
10.0000 mg | ORAL_TABLET | Freq: Every day | ORAL | Status: DC
Start: 2016-03-31 — End: 2016-04-01
  Administered 2016-03-31 – 2016-04-01 (×2): 10 mg via ORAL
  Filled 2016-03-31 (×2): qty 1

## 2016-03-31 MED ORDER — METOPROLOL SUCCINATE ER 50 MG PO TB24
100.0000 mg | ORAL_TABLET | Freq: Every day | ORAL | Status: DC
  Administered 2016-03-31: 09:00:00 100 mg via ORAL
  Filled 2016-03-31: qty 2

## 2016-03-31 MED ORDER — ENSURE ENLIVE PO LIQD
237.0000 mL | Freq: Four times a day (QID) | ORAL | Status: DC
  Administered 2016-03-31 – 2016-04-03 (×9): 237 mL via ORAL

## 2016-03-31 MED ORDER — POTASSIUM CHLORIDE IN NACL 40-0.9 MEQ/L-% IV SOLN
INTRAVENOUS | Status: DC
  Administered 2016-03-31: 06:00:00 125 mL/h via INTRAVENOUS
  Filled 2016-03-31 (×3): qty 1000

## 2016-03-31 MED ORDER — SODIUM CHLORIDE 0.9 % IV BOLUS (SEPSIS)
1000.0000 mL | Freq: Once | INTRAVENOUS | Status: AC
  Administered 2016-03-31: 1000 mL via INTRAVENOUS

## 2016-03-31 MED ORDER — ONDANSETRON HCL 4 MG/2ML IJ SOLN: 4.0000 mg | Freq: Four times a day (QID) | INTRAMUSCULAR | Status: DC | PRN

## 2016-03-31 MED ORDER — POTASSIUM CHLORIDE IN NACL 20-0.9 MEQ/L-% IV SOLN
INTRAVENOUS | Status: DC
Start: 2016-03-31 — End: 2016-04-01
  Administered 2016-03-31: 14:00:00 via INTRAVENOUS
  Filled 2016-03-31 (×3): qty 1000

## 2016-03-31 MED ORDER — PIPERACILLIN-TAZOBACTAM 3.375 G IVPB
3.3750 g | Freq: Three times a day (TID) | INTRAVENOUS | Status: DC
  Administered 2016-03-31 – 2016-04-01 (×3): 3.375 g via INTRAVENOUS
  Filled 2016-03-31 (×3): qty 50

## 2016-03-31 MED ORDER — PIPERACILLIN-TAZOBACTAM 3.375 G IVPB
3.3750 g | Freq: Once | INTRAVENOUS | Status: AC
  Administered 2016-03-31: 3.375 g via INTRAVENOUS
  Filled 2016-03-31: qty 50

## 2016-03-31 MED ORDER — ACETAMINOPHEN 325 MG PO TABS: 650.0000 mg | ORAL_TABLET | Freq: Four times a day (QID) | ORAL | Status: DC | PRN

## 2016-03-31 NOTE — Progress Notes (Signed)
[name], male   DOB: [birth year], 65 y.o.   MRN: [id number]  [employer] PROGRESS NOTE  David Archer JSH:[id number] DOB: 08-22-1950 DOA: 03/30/2016 PCP: Sherrin Daisy, MD  HPI/Subjective: Patient states that he had a fever. In the ER he was initially tachycardic and had a high white count. Hospitalist services were contacted for admission at that time. Patient recently was in the hospital for diarrhea. States he's had no further diarrhea. Denies cough. No burning on urination. Patient is not the best historian.  Objective: Vitals:   03/31/16 0512 03/31/16 1348  BP: 115/66 110/73  Pulse: 84 78  Resp: 16   Temp: 99 F (37.2 C) 98.6 F (37 C)    Filed Weights   03/30/16 2319 03/31/16 0512  Weight: 80.3 kg (177 lb) 80.4 kg (177 lb 5 oz)    ROS: Review of Systems  Constitutional: Positive for fever. Negative for chills.  Eyes: Negative for blurred vision.  Respiratory: Negative for cough and shortness of breath.   Cardiovascular: Negative for chest pain.  Gastrointestinal: Negative for abdominal pain, constipation, diarrhea, nausea and vomiting.  Genitourinary: Negative for dysuria.  Musculoskeletal: Negative for joint pain.  Neurological: Negative for dizziness and headaches.   Exam: Physical Exam  HENT:  Nose: No mucosal edema.  Mouth/Throat: No oropharyngeal exudate or posterior oropharyngeal edema.  Eyes: Conjunctivae, EOM and lids are normal. Pupils are equal, round, and reactive to light.  Neck: No JVD present. Carotid bruit is not present. No edema present. No thyroid mass and no thyromegaly present.  Cardiovascular: S1 normal and S2 normal.  Exam reveals no gallop.   No murmur heard. Pulses:      Dorsalis pedis pulses are 2+ on the right side, and 2+ on the left side.  Respiratory: No respiratory distress. He has no wheezes. He has no rhonchi. He has no rales.  GI: Soft. Bowel sounds are normal. There is no tenderness.   Musculoskeletal:       Right ankle: He exhibits no swelling.       Left ankle: He exhibits no swelling.  Lymphadenopathy:    He has no cervical adenopathy.  Neurological: He is alert.  Skin: Skin is warm. No rash noted. Nails show no clubbing.  Slight skin redness on buttock sacral area. No signs of infection  Psychiatric: He has a normal mood and affect.      Data Reviewed: Basic Metabolic Panel:  Recent Labs Lab 03/26/16 1443 03/27/16 0703 03/30/16 2354 03/31/16 0523  NA 133* 138 135 136  K 3.1* 2.9* 2.5* 3.2*  CL 94* 105 98* 100*  CO2 '25 24 25 27  '$ GLUCOSE 99 77 110* 103*  BUN 22* '13 13 11  '$ CREATININE 0.86 0.63 0.95 0.85  CALCIUM 9.1 8.2* 8.3* 8.1*  MG  --   --   --  1.2*   Liver Function Tests:  Recent Labs Lab 03/26/16 1443 03/27/16 0703 03/30/16 2354  AST '25 22 31  '$ ALT '21 20 24  '$ ALKPHOS 61 49 81  BILITOT 1.0 0.7 1.0  PROT 7.7 5.8* 6.5  ALBUMIN 3.5 2.6* 3.1*   CBC:  Recent Labs Lab 03/26/16 1443 03/27/16 0703 03/30/16 2354  WBC 1.7* 2.7* 19.5*  NEUTROABS 0.5*  --  14.2*  HGB 11.1* 7.1* 10.4*  HCT 32.0* 20.6* 30.0*  MCV 98.9 99.4 98.9  PLT 34* 26* 86*   Cardiac Enzymes:  Recent Labs Lab 03/26/16 1443 03/30/16 2354  TROPONINI <0.03 0.04*  CBG:  Recent Labs Lab 03/27/16 0734 03/27/16 0819 03/27/16 1150 03/31/16 0732 03/31/16 1127  GLUCAP 66 77 110* 94 94    Recent Results (from the past 240 hour(s))  C difficile quick scan w PCR reflex     Status: None   Collection Time: 03/27/16 12:00 AM  Result Value Ref Range Status   C Diff antigen NEGATIVE NEGATIVE Final   C Diff toxin NEGATIVE NEGATIVE Final   C Diff interpretation No C. difficile detected.  Final  Culture, blood (routine x 2)     Status: None (Preliminary result)   Collection Time: 03/30/16 11:54 PM  Result Value Ref Range Status   Specimen Description BLOOD RIGHT ASSIST CONTROL  Final   Special Requests   Final    BOTTLES DRAWN AEROBIC AND ANAEROBIC AER 9ML ANA  11ML   Culture NO GROWTH < 12 HOURS  Final   Report Status PENDING  Incomplete  Culture, blood (routine x 2)     Status: None (Preliminary result)   Collection Time: 03/30/16 11:54 PM  Result Value Ref Range Status   Specimen Description BLOOD  LEFT FOREARM  Final   Special Requests   Final    BOTTLES DRAWN AEROBIC AND ANAEROBIC  ANA 1ML AER 1ML   Culture NO GROWTH < 12 HOURS  Final   Report Status PENDING  Incomplete     Studies: Dg Chest 2 View  Result Date: 03/31/2016 CLINICAL DATA:  Fever on chemotherapy EXAM: CHEST  2 VIEW COMPARISON:  Chest CT 08/11/2015 FINDINGS: Cardiomediastinal contours are normal. No focal airspace consolidation or pulmonary edema. No pneumothorax or pleural effusion. IMPRESSION: No active cardiopulmonary disease. Electronically Signed   By: Ulyses Jarred M.D.   On: 03/31/2016 00:39    Scheduled Meds: . amiodarone  200 mg Oral Daily  . docusate sodium  100 mg Oral BID  . heparin  5,000 Units Subcutaneous Q8H  . insulin aspart  0-15 Units Subcutaneous TID WC  . insulin glargine  6 Units Subcutaneous QHS  . magnesium sulfate 1 - 4 g bolus IVPB  4 g Intravenous Once  . metoprolol succinate  100 mg Oral Daily  . nortriptyline  10 mg Oral QHS  . piperacillin-tazobactam (ZOSYN)  IV  3.375 g Intravenous Q8H  . rosuvastatin  10 mg Oral Daily  . sodium chloride flush  3 mL Intravenous Q12H  . vancomycin  1,250 mg Intravenous Q12H   Continuous Infusions: . 0.9 % NaCl with KCl 20 mEq / L 50 mL/hr at 03/31/16 1357    Assessment/Plan:  1. Fever, initial tachycardia and leukocytosis. Admitting physician thought this may be sepsis but I do not have a source of fever at this point. The patient is on aggressive antibiotics but am not sure what I'm treating. Chest x-ray is negative. Urinalysis negative. So far blood cultures are negative. Since the patient recently had diarrhea I will send off a GI panel if any further diarrhea. White blood cell count could be  elevated secondary to Neupogen. Tachycardia could be elevated with dehydration. 2. Weakness. Physical therapy evaluation 3. 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 Overall prognosis is poor. Listed as a full code. Unable to contact wife. Put in for palliative care consultation. 4. Severe hypomagnesemia. Replace 4 g magnesium IV 1 5. Severe hypokalemia on presentation. Replace potassium in IV fluids and orally 6. Atrial fibrillation history on amiodarone and metoprolol 7. Stop statin  Code Status:     Code Status Orders  Start     Ordered   03/31/16 0520  Full code  Continuous     03/31/16 0519    Code Status History    Date Active Date Inactive Code Status Order ID Comments User Context   03/26/2016  8:06 PM 03/27/2016  4:56 PM Full Code [id number]  Nicholes Mango, MD Inpatient   12/29/2015  1:21 AM 12/31/2015  4:03 PM Full Code [id number]  Lance Coon, MD Inpatient   12/16/2015  7:14 PM 12/19/2015  5:07 PM Full Code [id number]  Newman Pies, MD Inpatient    Advance Directive Documentation   Flowsheet Row Most Recent Value  Type of Advance Directive  Healthcare Power of Attorney, Living will  Pre-existing out of facility DNR order (yellow form or pink MOST form)  No data  "MOST" Form in Place?  No data     Family Communication: Left message for wife Disposition Plan: To be determined  Antibiotics:  Zosyn  Vancomycin  Time spent: 35 minutes  Loletha Grayer  [employer]

## 2016-03-31 NOTE — Progress Notes (Signed)
Initial Nutrition Assessment  DOCUMENTATION CODES:   Severe malnutrition in context of chronic illness  INTERVENTION:  Provide Ensure Enlive po 4 times daily, each supplement provides 350 kcal and 20 grams of protein. If patient is able to meet more calorie and protein needs through food, can decrease number of Ensure given.  Provided patient with "Making the Most of Each Bite" handout from the [employer]. Reviewed importance of adequate calories and protein through meals, snacks, and beverages and discussed strategies to meet increased needs.   Will monitor for outcomes regarding goals of care discussion with Palliative Medicine. At this time patient remains full code.  NUTRITION DIAGNOSIS:   Inadequate oral intake related to cancer and cancer related treatments, poor appetite as evidenced by 26 percent weight loss over 3.5 months, per patient/family report.  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    -S/p craniotomy left frontal lobe 08/17/2015. -Patient underwent XRT whole brain 9/26-10/13 -Patient s/p cycle 2 day 2 of etoposide/cisplatin on 11/15, and day 3 on 11/16. -Noted in chart where patient receiving treatment for cancer that he had been referred to see a Dietitian, but did not see any RD notes, so unsure if he has been followed yet.  -As writing note, saw there was a consult in for Palliative Care to discuss Goals of Care.  Spoke with patient at bedside. He reports his appetite has been on-and-off for a while now. He reports that some days he can eat and others he  can't. He can almost always tolerate toast, and recently he starting drinking Ensure up to 2 bottles per day. Toast + 2 Ensure is not adequate intake for patient. Denies N/V, abdominal pain, constipation/diarrhea, or difficulty chewing/swallowing.   Reports UBW of 240 lbs and that he has lost weight. Per chart patient has lost 63 lbs (26% body weight) over 3.5 months, which is significant for time frame.  Meal Completion: 100% of breakfast this morning (was only toast with apple juice - 183 kcal, 2 grams protein). Per [facility] he did not want lunch today, but did order a dinner of baked fish with fries (466 kcal and 24 grams protein - unsure how much he will finish).   Medications reviewed and include: Colace, Novolog sliding scale TID with meals and daily at bedtime, Lantus 6 units daily at bedtime, magnesium sulfate 4 grams once today, Zosyn, vancomycin, NS with KCl 20 mEq/L @ 50 ml/hr.  Labs reviewed: CBG 94 on both checks this admission, Potassium 3.2, Chloride 100, Magnesium 1.2, Platelets 86 (were even lower at 26 on 11/26).  Nutrition-Focused physical exam completed. Findings are moderate fat depletion, moderate to severe muscle depletion, and no edema.  Patient meets criteria for severe chronic malnutrition in setting of 26% weight loss over 3.5 months, intake </= 75% estimated energy requirement for >/= 1 month, and severe muscle depletion.  Discussed with RN.   Diet Order:  Diet regular Room service appropriate? Yes; Fluid consistency: Thin  Skin:  Reviewed, no issues  Last BM:  03/27/2016  Height:   Ht Readings from Last  1 Encounters:  03/31/16 '5\' 11"'$  (1.803 m)    Weight:   Wt Readings from Last 1 Encounters:  03/31/16 177 lb 5 oz (80.4 kg)    Ideal Body Weight:  78.18 kg  BMI:  Body mass index is 24.73 kg/m.  Estimated Nutritional Needs:   Kcal:  2400-2800 (30-35 kcal/kg)  Protein:  113-130 grams (1.4-1.6 grams/kg)  Fluid:  >/= 2 L/day (25  ml/kg)  EDUCATION NEEDS:   Education needs addressed (Making the Most of Each Bite handout. Encouraged strategies to achieve adequate calories and protein, even with poor appetite.)  Willey Blade, MS, RD, LDN Pager: [phone] After Hours Pager: [phone]

## 2016-03-31 NOTE — Evaluation (Signed)
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 He is s/p frontal love resection in August 2017, he recently finished a course of chemotherapy and brain radiation. He was admitted currently for a fever.   Clinical Impression  Patient evaluated for weakness on this admission. 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 In this evaluation he is able to ambulate to the door and return to his chair with reliance on IV pole for balance with bilateral UEs, markedly shorter steps than baseline. He also requires mod A x1 to complete bed mobility, secondary to trunkal weakness. Given his decline (recently received a bout of chemotherapy and radiation) in physical capacity, would strongly recommend STR after discharge at this time.     Follow Up Recommendations SNF    Equipment Recommendations       Recommendations for Other Services       Precautions / Restrictions Precautions Precautions: Fall Restrictions Weight Bearing Restrictions: No      Mobility  Bed Mobility Overal bed mobility: Needs Assistance Bed Mobility: Supine to Sit     Supine to sit: Mod assist     General bed mobility comments: Patient unable to elevate chest without PT assistance, reports he has been having increasing difficulty exiting bed at home.   Transfers Overall transfer level: Needs assistance   Transfers: Sit to/from Stand Sit to Stand: Min guard         General transfer comment: Slow, trunk flexed throughout indicative of higher falls risk. Unsteady throughout, but able to hold on to furniture to steady.   Ambulation/Gait Ambulation/Gait assistance: Min guard Ambulation Distance (Feet): 25 Feet Assistive device:  (2 hands  on IV pole ) Gait Pattern/deviations: Decreased step length - right;Decreased step length - left;Shuffle   Gait velocity interpretation: <1.8 ft/sec, indicative of risk for recurrent falls General Gait Details: Patient pushes along IV pole as he requires UE assistance for balance. Step length severely limited, he fatigues by the time he reaches the door and asks to return to chair.   Stairs            Wheelchair Mobility    Modified Rankin (Stroke Patients Only)       Balance Overall balance assessment: Needs assistance Sitting-balance support: No upper extremity supported;Feet supported Sitting balance-Leahy Scale: Good     Standing balance support: Bilateral upper extremity supported Standing balance-Leahy Scale: Poor Standing balance comment: Patient requires IV pole to hold on to for ambulation.                              Pertinent Vitals/Pain Pain Assessment: No/denies pain    Home Living Family/patient expects to be discharged to:: Private residence Living Arrangements: Spouse/significant other Available Help at Discharge: Family;Available 24 hours/day Type of Home: House Home Access: Stairs to enter Entrance Stairs-Rails: Can reach both Entrance Stairs-Number of Steps: 2 Home Layout: One level Home Equipment: Walker - 2 wheels;Cane - single point      Prior Function Level of Independence: Independent         Comments: Per patient he has had increasing difficulty with exiting the bed, limited with ambulation distance in the home.  Hand Dominance        Extremity/Trunk Assessment   Upper Extremity Assessment: Overall WFL for tasks assessed           Lower Extremity Assessment: Overall WFL for tasks assessed         Communication   Communication: No difficulties (Appeared to have delays in processing (increased time to respond) could be a side effect of chemo/radiation)  Cognition Arousal/Alertness: Awake/alert Behavior  During Therapy: WFL for tasks assessed/performed Overall Cognitive Status: Within Functional Limits for tasks assessed (Delays in processing, consistent with side effects of chemotherapy/radiation)                      General Comments      Exercises     Assessment/Plan    PT Assessment Patient needs continued PT services  PT Problem List Decreased strength;Decreased balance;Decreased mobility;Decreased activity tolerance          PT Treatment Interventions Gait training;Stair training;Therapeutic exercise;Therapeutic activities;Balance training;Neuromuscular re-education    PT Goals (Current goals can be found in the Care Plan section)  Acute Rehab PT Goals Patient Stated Goal: To return home  PT Goal Formulation: With patient Time For Goal Achievement: 04/14/16 Potential to Achieve Goals: Good    Frequency Min 2X/week   Barriers to discharge        Co-evaluation               End of Session Equipment Utilized During Treatment: Gait belt Activity Tolerance: Patient limited by fatigue Patient left: in chair;with call bell/phone within reach;with chair alarm set;with family/visitor present Nurse Communication: Mobility status    Functional Assessment Tool Used: Clinical judgement  Functional Limitation: Mobility: Walking and moving around Mobility: Walking and Moving Around Current Status [phone]): At least 40 percent but less than 60 percent impaired, limited or restricted Mobility: Walking and Moving Around Goal Status [phone]): At least 20 percent but less than 40 percent impaired, limited or restricted    Time: 1459-1510 PT Time Calculation (min) (ACUTE ONLY): 11 min   Charges:   PT Evaluation $PT Eval Moderate Complexity: 1 Procedure     PT G Codes:   PT G-Codes **NOT FOR INPATIENT CLASS** Functional Assessment Tool Used: Clinical judgement  Functional Limitation: Mobility: Walking and moving around Mobility: Walking and Moving Around Current  Status (B2841): At least 40 percent but less than 60 percent impaired, limited or restricted Mobility: Walking and Moving Around Goal Status [phone]): At least 20 percent but less than 40 percent impaired, limited or restricted   Kerman Passey, PT, DPT    03/31/2016, 3:28 PM

## 2016-03-31 NOTE — ED Notes (Signed)
This RN attempted IV x2 with no success, blood obtained with 2nd IV try, but unable to maintain IV line.  Liana Crocker, RN attempted IV x2 with no success.  Spoke with Modena Nunnery, RN to attempt ultrasound guided IV.

## 2016-03-31 NOTE — Progress Notes (Signed)
Pharmacy Antibiotic Note  David Archer is a 65 y.o. male admitted on 03/30/2016 with sepsis.  Pharmacy has been consulted for vancomycin and Zosyn dosing.  Plan: DW 80.3kg  Vd 56L kei 0.075 hr-1  T1/2 9 hours Vancomycin 1250 mg q 12 hours ordered with stacked dosing. Level before 5th dose. Goal trough 15-20.    Zosyn 3.375 grams q 8 hours ordered.  Height: '5\' 11"'$  (180.3 cm) Weight: 177 lb 5 oz (80.4 kg) IBW/kg (Calculated) : 75.3  Temp (24hrs), Avg:99 F (37.2 C), Min:98.9 F (37.2 C), Max:99 F (37.2 C)   Recent Labs Lab 03/26/16 1443 03/27/16 0703 03/30/16 0011 03/30/16 2354  WBC 1.7* 2.7*  --  19.5*  CREATININE 0.86 0.63  --  0.95  LATICACIDVEN  --   --  1.7  --     Estimated Creatinine Clearance: 84.8 mL/min (by C-G formula based on SCr of 0.95 mg/dL).    No Known Allergies  Antimicrobials this admission: vancomycin 11/30 >>  Zosyn 11/30 >>   Dose adjustments this admission:   Microbiology results: 11/29 BCx: pending 8/18 MRSA PCR: (-)    11/30 CXR: no acute disease 11/29 UA: pending  Thank you for allowing pharmacy to be a part of this patient's care.  Rena Sweeden S 03/31/2016 5:46 AM

## 2016-03-31 NOTE — ED Provider Notes (Signed)
[facility] Emergency Department Provider Note   ____________________________________________   First MD Initiated Contact with Patient 03/30/16 2352     (approximate)  I have reviewed the triage vital signs and the nursing notes.   HISTORY  Chief Complaint Fever    HPI DONTARIO EVETTS is a 65 y.o. male who presents to the ED from home with a chief complaint of fever. Patient has metastatic lung cancer status post chemotherapy 11/16. He was hospitalized 11/25 to 11/26 for generalized weakness, hyponatremia and diarrhea.Notes weakness persists after hospitalization. Wife states patient had fever last evening of 101.62F. Symptoms associated with chills. Patient did not take antipyretic prior to arrival. Denies headache, neck pain, cough, congestion, chest pain, shortness of breath, abdominal pain, nausea, vomiting, dysuria. States diarrhea has resolved since his hospitalization. Denies recent travel or trauma. Nothing makes the symptoms better or worse.   Past Medical History:  Diagnosis Date  . Arthritis   . Brain tumor ([place]) 8-9/17   unsure if cancerous at this time  . Diabetes mellitus without complication (HCC)    borderline  . Dysrhythmia    tachycardia  . GERD (gastroesophageal reflux disease)    takes OTC MEDS  . Headache   . Kidney stones    cant remember when  . Pre-diabetes   . Sleep apnea    back in 2000.  Hasn't been retested  . Tachycardia     Patient Active Problem List   Diagnosis Date Noted  . Acute gastroenteritis 03/26/2016  . Atrial fibrillation with RVR ([place]) 12/29/2015  . Atrial fibrillation ([place]) 08/18/2015  . Sleep apnea   . Diabetes mellitus without complication ([place])   . S/P craniotomy 08/17/2015  . Brain tumor ([place]) 08/17/2015    Past Surgical History:  Procedure Laterality Date  . APPLICATION OF CRANIAL NAVIGATION N/A 08/17/2015   Procedure: APPLICATION OF CRANIAL NAVIGATION;  Surgeon: Newman Pies, MD;   Location: [place] [facility]UR[facility] ORS;  Service: Neurosurgery;  Laterality: N/A;  . APPLICATION OF CRANIAL NAVIGATION N/A 12/16/2015   Procedure: APPLICATION OF CRANIAL NAVIGATION;  Surgeon: Newman Pies, MD;  Location: [place] NEURO ORS;  Service: Neurosurgery;  Laterality: N/A;  . COLONOSCOPY    . CRANIOTOMY Left 12/16/2015   Procedure: CRANIOTOMY LEFT FRONTAL;  Surgeon: Newman Pies, MD;  Location: [place] NEURO ORS;  Service: Neurosurgery;  Laterality: Left;  . HAS NEVER HAD     HAS NEVER HAD ANESTHESIA--NO SURGERIES  . PR DURAL GRAFT REPAIR,SPINE DEFECT Left 08/17/2015   Procedure: STEREOTACTIC BRAIN BIOPSY WITH [place] R[place]ke NAVIGATION;  Surgeon: Newman Pies, MD;  Location: [place] NEURO ORS;  Service: Neurosurgery;  Laterality: Left;  Stereotactic brain biopsy with brainlab    Prior to Admission medications   Medication Sig Start Date End Date Taking? Authorizing Provider  amiodarone (PACERONE) 200 MG tablet Take 1 tablet (200 mg total) by mouth daily. 03/27/16   Bettey Costa, MD  fluticasone (FLONASE) 50 MCG/ACT nasal spray Place 2 sprays into both nostrils daily as needed for allergies or rhinitis.    Historical Provider, MD  HYDROcodone-acetaminophen (NORCO/VICODIN) 5-325 MG tablet Take 1-2 tablets by mouth every 4 (four) hours as needed for moderate pain. 12/19/15   Earnie Larsson, MD  insulin aspart (NOVOLOG) 100 UNIT/ML injection Inject 6 Units into the skin 3 (three) times daily before meals.    Historical Provider, MD  insulin glargine (LANTUS) 100 UNIT/ML injection Inject 0.1 mLs (10 Units total) into the skin at bedtime. 03/27/16   Bettey Costa, MD  metoprolol  succinate (TOPROL-XL) 100 MG 24 hr tablet Take 1 tablet (100 mg total) by mouth daily. Take with or immediately following a meal. 08/20/15   Newman Pies, MD  nortriptyline (PAMELOR) 10 MG capsule Take 10 mg by mouth at bedtime.     Historical Provider, MD  rosuvastatin (CRESTOR) 10 MG tablet Take 10 mg by mouth daily.    Historical Provider, MD     Allergies Patient has no known allergies.  No family history on file.  Social History Social History  Substance Use Topics  . Smoking status: Former Smoker    Packs/day: 1.00    Years: 70.00    Types: Cigarettes    Quit date: 12/08/2008  . Smokeless tobacco: Former Systems developer    Quit date: 08/13/2008  . Alcohol use No     Comment: SWITCHES BETWEEN BEVERAGES-- GOES IN SPELLS    Review of Systems  Constitutional: Positive for fever/chills. Eyes: No visual changes. ENT: No sore throat. Cardiovascular: Denies chest pain. Respiratory: Denies shortness of breath. Gastrointestinal: No abdominal pain.  No nausea, no vomiting.  No diarrhea.  No constipation. Genitourinary: Negative for dysuria. Musculoskeletal: Negative for back pain. Skin: Negative for rash. Neurological: Negative for headaches, focal weakness or numbness.  10-point ROS otherwise negative.  ____________________________________________   PHYSICAL EXAM:  VITAL SIGNS: ED Triage Vitals  Enc Vitals Group     BP 03/30/16 2320 101/70     Pulse Rate 03/30/16 2318 (!) 101     Resp 03/30/16 2318 20     Temp 03/30/16 2318 98.9 F (37.2 C)     Temp Source 03/30/16 2318 Oral     SpO2 03/30/16 2318 99 %     Weight 03/30/16 2319 177 lb (80.3 kg)     Height 03/30/16 2319 '5\' 11"'$  (1.803 m)     Head Circumference --      Peak Flow --      Pain Score --      Pain Loc --      Pain Edu? --      Excl. in [place]? --     Constitutional: Alert and oriented. Chronically ill appearing and in no acute distress. Eyes: Conjunctivae are normal. PERRL. EOMI. Head: Atraumatic. Nose: No congestion/rhinnorhea. Mouth/Throat: Mucous membranes are moist.  Oropharynx non-erythematous. Neck: No stridor.  Supple neck without meningismus. Hematological/Lymphatic/Immunilogical: No cervical lymphadenopathy. Cardiovascular: Normal rate, regular rhythm. Grossly normal heart sounds.  Good peripheral circulation. Respiratory: Normal respiratory  effort.  No retractions. Lungs CTAB. Gastrointestinal: Soft and nontender. No distention. No abdominal bruits. No CVA tenderness. Musculoskeletal: No lower extremity tenderness nor edema.  No joint effusions. Neurologic:  Normal speech and language. No gross focal neurologic deficits are appreciated. No gait instability. Skin:  Skin is warm, dry and intact. No rash noted. No petechiae. Psychiatric: Mood and affect are normal. Speech and behavior are normal.  ____________________________________________   LABS (all labs ordered are listed, but only abnormal results are displayed)  Labs Reviewed  CBC WITH DIFFERENTIAL/PLATELET - Abnormal; Notable for the following:       Result Value   WBC 19.5 (*)    RBC 3.03 (*)    Hemoglobin 10.4 (*)    HCT 30.0 (*)    MCH 34.3 (*)    RDW 16.8 (*)    Platelets 86 (*)    nRBC 2 (*)    Neutro Abs 14.2 (*)    Monocytes Absolute 1.8 (*)    All other components within normal limits  COMPREHENSIVE METABOLIC  PANEL - Abnormal; Notable for the following:    Potassium 2.5 (*)    Chloride 98 (*)    Glucose, Bld 110 (*)    Calcium 8.3 (*)    Albumin 3.1 (*)    All other components within normal limits  TROPONIN I - Abnormal; Notable for the following:    Troponin I 0.04 (*)    All other components within normal limits  CULTURE, BLOOD (ROUTINE X 2)  CULTURE, BLOOD (ROUTINE X 2)  URINE CULTURE  LACTIC ACID, PLASMA  URINALYSIS COMPLETEWITH MICROSCOPIC ([facility] ONLY)  LACTIC ACID, PLASMA   ____________________________________________  EKG  ED ECG REPORT I, Sarayu Prevost J, the attending physician, personally viewed and interpreted this ECG.   Date: 03/31/2016  EKG Time: 0007  Rate: 85  Rhythm: normal EKG, normal sinus rhythm  Axis: Normal  Intervals:none  ST&T Change: Nonspecific  ____________________________________________  RADIOLOGY  Chest 2 view (viewed by me, interpreted per Dr. Collins [place]): No active cardiopulmonary  disease. ____________________________________________   PROCEDURES  Procedure(s) performed: None  Procedures  Critical Care performed: No  ____________________________________________   INITIAL IMPRESSION / ASSESSMENT AND PLAN / ED COURSE  Pertinent labs & imaging results that were available during my care of the patient were reviewed by me and considered in my medical decision making (see chart for details).  65 year old male with metastatic lung cancer who presents with fever and generalized weakness. Review of patient's chart reveals recent pancytopenia which is most likely secondary to chemotherapy. Will obtain screening lab work including lactate, chest x-ray, infuse IV fluids and reassess.  Clinical Course as of Mar 31 228  Thu Mar 31, 2016  0134 Updated patient and spouse of laboratory imaging results. Potassium was repleted orally. Will initiate broad-spectrum IV antibiotics after patient is able to provide a urine specimen. Will discuss with hospitalist to evaluate patient in the emergency department for admission.  [JS]    Clinical Course User Index [JS] Paulette Blanch, MD     ____________________________________________   FINAL CLINICAL IMPRESSION(S) / ED DIAGNOSES  Final diagnoses:  FUO (fever of unknown origin)  Hypokalemia  Elevated troponin  Weakness      NEW MEDICATIONS STARTED DURING THIS VISIT:  New Prescriptions   No medications on file     Note:  This document was prepared using Dragon voice recognition software and may include unintentional dictation errors.    Paulette Blanch, MD 03/31/16 [phone]

## 2016-03-31 NOTE — NC FL2 (Signed)
[place] LEVEL OF CARE SCREENING TOOL     IDENTIFICATION  Patient Name: David Archer Birthdate: 1951/02/05 Sex: male Admission Date (Current Location): 03/30/2016  [place] and [place] Number:  Engineering geologist and Address:  [facility], [address], [place], [place] [address]      Provider Number: [id number]  Attending Physician Name and Address:  Loletha Grayer, MD  Relative Name and Phone Number:       Current Level of Care: Hospital Recommended Level of Care: [place] Prior Approval Number:    Date Approved/Denied:   PASRR Number:  [id number])  Discharge Plan: SNF    Current Diagnoses: Patient Active Problem List   Diagnosis Date Noted  . Sepsis ([place]) 03/31/2016  . Acute gastroenteritis 03/26/2016  . Atrial fibrillation with RVR ([place]) 12/29/2015  . Atrial fibrillation ([place]) 08/18/2015  . Sleep apnea   . Diabetes mellitus without complication ([place])   . S/P craniotomy 08/17/2015  . Brain tumor ([place]) 08/17/2015    Orientation RESPIRATION BLADDER Height & Weight     Self, Time, Situation, Place  Normal Continent Weight: 177 lb 5 oz (80.4 kg) Height:  '5\' 11"'$  (180.3 cm)  BEHAVIORAL SYMPTOMS/MOOD NEUROLOGICAL BOWEL NUTRITION STATUS   (none)   Continent Diet (Diet: Regular )  AMBULATORY STATUS COMMUNICATION OF NEEDS Skin   Extensive Assist Verbally Normal                       Personal Care Assistance Level of Assistance  Bathing, Feeding, Dressing Bathing Assistance: Limited assistance Feeding assistance: Independent Dressing Assistance: Limited assistance     Functional Limitations Info  Sight, Hearing, Speech Sight Info: Adequate Hearing Info: Adequate Speech Info: Adequate    SPECIAL CARE FACTORS FREQUENCY  PT (By licensed PT), OT (By licensed OT)     PT Frequency:  (5) OT Frequency:  (5)            Contractures      Additional Factors Info  Code Status,  Allergies, Insulin Sliding Scale, Isolation Precautions Code Status Info:  (Full Code. ) Allergies Info:  (No Known Allergies. )   Insulin Sliding Scale Info:  (NovoLog Insulin Injections. ) Isolation Precautions Info:  (Enteric precautions. )     Current Medications (03/31/2016):  This is the current hospital active medication list Current Facility-Administered Medications  Medication Dose Route Frequency Provider Last Rate Last Dose  . 0.9 % NaCl with KCl 20 mEq/ L  infusion   Intravenous Continuous Loletha Grayer, MD 50 mL/hr at 03/31/16 1357    . acetaminophen (TYLENOL) tablet 650 mg  650 mg Oral Q6H PRN Harrie Foreman, MD       Or  . acetaminophen (TYLENOL) suppository 650 mg  650 mg Rectal Q6H PRN Harrie Foreman, MD      . amiodarone (PACERONE) tablet 200 mg  200 mg Oral Daily Harrie Foreman, MD   200 mg at 03/31/16 6712  . docusate sodium (COLACE) capsule 100 mg  100 mg Oral BID Harrie Foreman, MD   100 mg at 03/31/16 4580  . feeding supplement (ENSURE ENLIVE) (ENSURE ENLIVE) liquid 237 mL  237 mL Oral QID Loletha Grayer, MD   237 mL at 03/31/16 1702  . fluticasone (FLONASE) 50 MCG/ACT nasal spray 2 spray  2 spray Each Nare Daily PRN Harrie Foreman, MD      . heparin injection 5,000 Units  5,000 Units Subcutaneous Q8H  Harrie Foreman, MD   5,000 Units at 03/31/16 1538  . HYDROcodone-acetaminophen (NORCO/VICODIN) 5-325 MG per tablet 1-2 tablet  1-2 tablet Oral Q4H PRN Harrie Foreman, MD      . insulin aspart (novoLOG) injection 0-15 Units  0-15 Units Subcutaneous TID WC Harrie Foreman, MD      . insulin glargine (LANTUS) injection 6 Units  6 Units Subcutaneous QHS Harrie Foreman, MD      . metoprolol succinate (TOPROL-XL) 24 hr tablet 100 mg  100 mg Oral Daily Harrie Foreman, MD   100 mg at 03/31/16 4718  . nortriptyline (PAMELOR) capsule 10 mg  10 mg Oral QHS Harrie Foreman, MD      . ondansetron [facility]) tablet 4 mg  4 mg Oral Q6H PRN Harrie Foreman, MD       Or  . ondansetron [facility]) injection 4 mg  4 mg Intravenous Q6H PRN Harrie Foreman, MD      . piperacillin-tazobactam (ZOSYN) IVPB 3.375 g  3.375 g Intravenous Q8H Harrie Foreman, MD   3.375 g at 03/31/16 1538  . rosuvastatin (CRESTOR) tablet 10 mg  10 mg Oral Daily Harrie Foreman, MD   10 mg at 03/31/16 5501  . sodium chloride flush (NS) 0.9 % injection 3 mL  3 mL Intravenous Q12H Harrie Foreman, MD      . vancomycin (VANCOCIN) 1,250 mg in sodium chloride 0.9 % 250 mL IVPB  1,250 mg Intravenous Q12H Harrie Foreman, MD   1,250 mg at 03/31/16 5868     Discharge Medications: Please see discharge summary for a list of discharge medications.  Relevant Imaging Results:  Relevant Lab Results:   Additional Information  (SSN: [id number])  Adamae Ricklefs, Veronia Beets, LCSW

## 2016-03-31 NOTE — H&P (Signed)
David Archer is an 65 y.o. male.   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 The patient had just been in the hospital 4 days ago with pancytopenia. Today he is currently 14 days out from chemotherapy and Neulasta treatment. The patient denies nausea, vomiting or diarrhea. He denies cough or shortness of breath at this time. No source of fever was found in the emergency department. He was started on broad-spectrum antibiotics in the emergency department staff called the hospitalist service for admission.  Past Medical History:  Diagnosis Date  . Arthritis   . Brain tumor ([place]) 8-9/17   unsure if cancerous at this time  . Diabetes mellitus without complication (HCC)    borderline  . Dysrhythmia    tachycardia  . GERD (gastroesophageal reflux disease)    takes OTC MEDS  . Headache   . Kidney stones    cant remember when  . Pre-diabetes   . Sleep apnea    back in 2000.  Hasn't been retested  . Tachycardia     Past Surgical History:  Procedure Laterality Date  . APPLICATION OF CRANIAL NAVIGATION N/A 08/17/2015   Procedure: APPLICATION OF CRANIAL NAVIGATION;  Surgeon: Newman Pies, MD;  Location: [place] [facility]UR[facility] ORS;  Service: Neurosurgery;  Laterality: N/A;  . APPLICATION OF CRANIAL NAVIGATION N/A 12/16/2015   Procedure: APPLICATION OF CRANIAL NAVIGATION;  Surgeon: Newman Pies, MD;  Location: [place] [facility] ORS;  Service: Neurosurgery;  Laterality: N/A;  . COLONOSCOPY    . CRANIOTOMY Left 12/16/2015   Procedure: CRANIOTOMY LEFT FRONTAL;  Surgeon: Newman Pies, MD;  Location: [place] NEURO ORS;  Service: Neurosurgery;  Laterality: Left;  . HAS NEVER HAD     HAS NEVER HAD ANESTHESIA--NO SURGERIES  . PR DURAL GRAFT REPAIR,SPINE DEFECT Left 08/17/2015   Procedure: STEREOTACTIC BRAIN BIOPSY WITH [place]athke  NAVIGATION;  Surgeon: Newman Pies, MD;  Location: [place] NEURO ORS;  Service: Neurosurgery;  Laterality: Left;  Stereotactic brain biopsy with brainlab    History reviewed. No pertinent family history. Social History:  reports that he quit smoking about 7 years ago. His smoking use included Cigarettes. He has a 70.00 pack-year smoking history. He quit smokeless tobacco use about 7 years ago. He reports that he does not drink alcohol or use drugs.  Allergies: No Known Allergies  Medications Prior to Admission  Medication Sig Dispense Refill  . amiodarone (PACERONE) 200 MG tablet Take 1 tablet (200 mg total) by mouth daily. 30 tablet 0  . fluticasone (FLONASE) 50 MCG/ACT nasal spray Place 2 sprays into both nostrils daily as needed for allergies or rhinitis.    Marland Kitchen HYDROcodone-acetaminophen (NORCO/VICODIN) 5-325 MG tablet Take 1-2 tablets by mouth every 4 (four) hours as needed for moderate pain. 60 tablet 0  . insulin aspart (NOVOLOG) 100 UNIT/ML injection Inject 6 Units into the skin 3 (three) times daily before meals.    . insulin glargine (LANTUS) 100 UNIT/ML injection Inject 0.1 mLs (10 Units total) into the skin at bedtime. 12 mL 0  . metoprolol succinate (TOPROL-XL) 100 MG 24 hr tablet Take 1 tablet (100 mg total) by mouth daily. Take with or immediately following a meal. 30 tablet 1  . nortriptyline (PAMELOR) 10 MG capsule Take 10 mg by mouth at bedtime.     . rosuvastatin (CRESTOR) 10 MG tablet Take 10 mg by mouth daily.  Results for orders placed or performed during the hospital encounter of 03/30/16 (from the past 48 hour(s))  Lactic acid, plasma     Status: None   Collection Time: 03/30/16 12:11 AM  Result Value Ref Range   Lactic Acid, Venous 1.7 0.5 - 1.9 mmol/L  CBC with Differential     Status: Abnormal   Collection Time: 03/30/16 11:54 PM  Result Value Ref Range   WBC 19.5 (H) 3.8 - 10.6 K/uL   RBC 3.03 (L) 4.40 - 5.90 MIL/uL   Hemoglobin 10.4 (L) 13.0 - 18.0 g/dL    HCT 49.4 (L) 47.3 - 52.0 %   MCV 98.9 80.0 - 100.0 fL   MCH 34.3 (H) 26.0 - 34.0 pg   MCHC 34.7 32.0 - 36.0 g/dL   RDW 95.8 (H) 44.1 - 71.2 %   Platelets 86 (L) 150 - 440 K/uL   Neutrophils Relative % 58 %   Lymphocytes Relative 18 %   Monocytes Relative 9 %   Eosinophils Relative 0 %   Basophils Relative 0 %   Band Neutrophils 11 %   Metamyelocytes Relative 2 %   Myelocytes 2 %   Promyelocytes Absolute 0 %   Blasts 0 %   nRBC 2 (H) 0 /100 WBC   Other 0 %   Neutro Abs 14.2 (H) 1.4 - 6.5 K/uL   Lymphs Abs 3.5 1.0 - 3.6 K/uL   Monocytes Absolute 1.8 (H) 0.2 - 1.0 K/uL   Eosinophils Absolute 0.0 0 - 0.7 K/uL   Basophils Absolute 0.0 0 - 0.1 K/uL   RBC Morphology MIXED RBC POPULATION     Comment: hypochromia  Comprehensive metabolic panel     Status: Abnormal   Collection Time: 03/30/16 11:54 PM  Result Value Ref Range   Sodium 135 135 - 145 mmol/L   Potassium 2.5 (LL) 3.5 - 5.1 mmol/L   Chloride 98 (L) 101 - 111 mmol/L   CO2 25 22 - 32 mmol/L   Glucose, Bld 110 (H) 65 - 99 mg/dL   BUN 13 6 - 20 mg/dL   Creatinine, Ser 7.87 0.61 - 1.24 mg/dL   Calcium 8.3 (L) 8.9 - 10.3 mg/dL   Total Protein 6.5 6.5 - 8.1 g/dL   Albumin 3.1 (L) 3.5 - 5.0 g/dL   AST 31 15 - 41 U/L   ALT 24 17 - 63 U/L   Alkaline Phosphatase 81 38 - 126 U/L   Total Bilirubin 1.0 0.3 - 1.2 mg/dL   GFR calc non Af Amer >60 >60 mL/min   GFR calc Af Amer >60 >60 mL/min    Comment: (NOTE) The eGFR has been calculated using the CKD EPI equation. This calculation has not been validated in all clinical situations. eGFR's persistently <60 mL/min signify possible Chronic Kidney Disease.    Anion gap 12 5 - 15  Troponin I     Status: Abnormal   Collection Time: 03/30/16 11:54 PM  Result Value Ref Range   Troponin I 0.04 (HH) <0.03 ng/mL    Comment: CRITICAL RESULT CALLED TO, READ BACK BY AND VERIFIED WITH IRIS GUIDRY ON 03/31/16 AT 0104 BY TLB   Lactic acid, plasma     Status: None   Collection Time:  03/31/16  5:23 AM  Result Value Ref Range   Lactic Acid, Venous 1.3 0.5 - 1.9 mmol/L  TSH     Status: None   Collection Time: 03/31/16  5:23 AM  Result Value Ref Range   TSH 0.939  0.350 - 4.500 uIU/mL    Comment: Performed by a 3rd Generation assay with a functional sensitivity of <=0.01 uIU/mL.   Dg Chest 2 View  Result Date: 03/31/2016 CLINICAL DATA:  Fever on chemotherapy EXAM: CHEST  2 VIEW COMPARISON:  Chest CT 08/11/2015 FINDINGS: Cardiomediastinal contours are normal. No focal airspace consolidation or pulmonary edema. No pneumothorax or pleural effusion. IMPRESSION: No active cardiopulmonary disease. Electronically Signed   By: Ulyses Jarred M.D.   On: 03/31/2016 00:39    Review of Systems  Constitutional: Positive for malaise/fatigue. Negative for chills and fever.  HENT: Negative for sore throat and tinnitus.   Eyes: Negative for blurred vision and redness.  Respiratory: Negative for cough and shortness of breath.   Cardiovascular: Negative for chest pain, palpitations, orthopnea and PND.  Gastrointestinal: Negative for abdominal pain, diarrhea, nausea and vomiting.  Genitourinary: Negative for dysuria, frequency and urgency.  Musculoskeletal: Negative for joint pain and myalgias.  Skin: Negative for rash.       No lesions  Neurological: Positive for weakness. Negative for speech change and focal weakness.  Endo/Heme/Allergies: Does not bruise/bleed easily.       No temperature intolerance  Psychiatric/Behavioral: Negative for depression and suicidal ideas.    Blood pressure 115/66, pulse 84, temperature 99 F (37.2 C), temperature source Oral, resp. rate 16, height _0  (1.803 m), weight 80.4 kg (177 lb 5 oz), SpO2 98 %. Physical Exam  Constitutional: He is oriented to person, place, and time. He appears well-developed and well-nourished. No distress.  HENT:  Head: Normocephalic and atraumatic.  Mouth/Throat: No oropharyngeal exudate.  Eyes: Conjunctivae and EOM  are normal. Pupils are equal, round, and reactive to light. No scleral icterus.  Neck: Normal range of motion. Neck supple. No JVD present. No tracheal deviation present. No thyromegaly present.  Cardiovascular: Normal rate, regular rhythm and normal heart sounds.  Exam reveals no gallop and no friction rub.   No murmur heard. Respiratory: Effort normal and breath sounds normal. No respiratory distress.  GI: Soft. Bowel sounds are normal. He exhibits no distension. There is no tenderness.  Genitourinary:  Genitourinary Comments: Deferred  Musculoskeletal: Normal range of motion. He exhibits no edema.  Lymphadenopathy:    He has no cervical adenopathy.  Neurological: He is alert and oriented to person, place, and time. No cranial nerve deficit.  Skin: Skin is warm and dry. No rash noted. No erythema.  Psychiatric: He has a normal mood and affect. His behavior is normal. Judgment and thought content normal.     Assessment/Plan This is a 65 year old male admitted for sepsis. 1. Sepsis: The patient criteria via leukocytosis and fever. He is immunocompromised thus we will continue Pilar Plate and Zosyn. It is unclear if his leukocytosis is from Neulasta or if is a response to possible bacteremia. Follow blood cultures for growth and sensitivities. The patient is hemodynamically stable. 2. Atrial fibrillation: Rate controlled; continue amiodarone and metoprolol XL 3. Diabetes mellitus type 2: Continue long-acting insulin as well as sliding scale. 4. Hyperlipidemia: Continue statin therapy 5. DVT prophylaxis: Heparin 6. GI prophylaxis: None The patient is a full code. Time spent on admission orders and patient care approximately 45 minutes  Harrie Foreman, MD 03/31/2016, 7:14 AM

## 2016-04-01 ENCOUNTER — Inpatient Hospital Stay: Payer: BLUE CROSS/BLUE SHIELD

## 2016-04-01 DIAGNOSIS — E43 Unspecified severe protein-calorie malnutrition: Secondary | ICD-10-CM | POA: Insufficient documentation

## 2016-04-01 LAB — GLUCOSE, CAPILLARY
Glucose-Capillary: 84 mg/dL (ref 65–99)
Glucose-Capillary: 90 mg/dL (ref 65–99)
Glucose-Capillary: 83 mg/dL (ref 65–99)
Glucose-Capillary: 92 mg/dL (ref 65–99)

## 2016-04-01 LAB — CBC
HCT: 25.3 % — ABNORMAL LOW (ref 40.0–52.0)
HEMOGLOBIN: 8.5 g/dL — AB (ref 13.0–18.0)
MCH: 33.6 pg (ref 26.0–34.0)
MCHC: 33.6 g/dL (ref 32.0–36.0)
MCV: 100 fL (ref 80.0–100.0)
Platelets: 90 10*3/uL — ABNORMAL LOW (ref 150–440)
RBC: 2.53 MIL/uL — ABNORMAL LOW (ref 4.40–5.90)
RDW: 17 % — AB (ref 11.5–14.5)
WBC: 18.9 10*3/uL — ABNORMAL HIGH (ref 3.8–10.6)

## 2016-04-01 LAB — BASIC METABOLIC PANEL
Anion gap: 7 (ref 5–15)
BUN: 11 mg/dL (ref 6–20)
CALCIUM: 7.3 mg/dL — AB (ref 8.9–10.3)
CHLORIDE: 102 mmol/L (ref 101–111)
CO2: 27 mmol/L (ref 22–32)
CREATININE: 0.76 mg/dL (ref 0.61–1.24)
GFR calc non Af Amer: >60 mL/min (ref >60)
Glucose, Bld: 95 mg/dL (ref 65–99)
Potassium: 3.3 mmol/L — ABNORMAL LOW (ref 3.5–5.1)
SODIUM: 136 mmol/L (ref 135–145)

## 2016-04-01 LAB — URINE CULTURE
Culture: NO GROWTH
Special Requests: NORMAL

## 2016-04-01 LAB — MAGNESIUM: Magnesium: 1.8 mg/dL (ref 1.7–2.4)

## 2016-04-01 MED ORDER — POTASSIUM CHLORIDE CRYS ER 20 MEQ PO TBCR
20.0000 meq | EXTENDED_RELEASE_TABLET | Freq: Every day | ORAL | Status: DC
  Administered 2016-04-01 – 2016-04-02 (×2): 20 meq via ORAL
  Filled 2016-04-01 (×2): qty 1

## 2016-04-01 MED ORDER — MAGNESIUM SULFATE 2 GM/50ML IV SOLN
2.0000 g | Freq: Once | INTRAVENOUS | Status: AC
  Administered 2016-04-01: 14:00:00 2 g via INTRAVENOUS
  Filled 2016-04-01: qty 50

## 2016-04-01 MED ORDER — CEFTRIAXONE SODIUM-DEXTROSE 1-3.74 GM-% IV SOLR
1.0000 g | INTRAVENOUS | Status: DC
  Administered 2016-04-01: 1 g via INTRAVENOUS
  Filled 2016-04-01: qty 50

## 2016-04-01 MED ORDER — METOPROLOL SUCCINATE ER 25 MG PO TB24
25.0000 mg | ORAL_TABLET | Freq: Every day | ORAL | Status: DC
  Administered 2016-04-02 – 2016-04-03 (×2): 25 mg via ORAL
  Filled 2016-04-01 (×2): qty 1

## 2016-04-01 MED ORDER — METOPROLOL SUCCINATE ER 50 MG PO TB24: 50.0000 mg | ORAL_TABLET | Freq: Every day | ORAL | Status: DC

## 2016-04-01 MED ORDER — DEXTROSE 5 % IV SOLN: 1.0000 g | INTRAVENOUS | Status: DC

## 2016-04-01 NOTE — Progress Notes (Signed)
Palliative:  Thank you for this consult. Due to high census and no palliative service on the weekend we will not be able to consult until Monday. We are sorry for this inconvenience. If discharge prior to Monday please consider outpatient palliative referral via [facility].   No Charge  Vinie Sill, NP Palliative Medicine Team Pager # [phone] (M-F 8a-5p) Team Phone # [phone] (Nights/Weekends)

## 2016-04-01 NOTE — Progress Notes (Signed)
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 He is s/p frontal love resection in August 2017, he recently finished a course of chemotherapy and brain radiation. He was admitted currently for a fever.     PT Comments    Pt in bed agrees to session.  Mod a to sit edge of bed.  Stood with min guard and verbal cues for hand placements throughout session.  He was able to ambulate to the door and back in his room with rolling walker and min guard.  Gait unsteady at times due to balance and weakness but no LOB's that required assist to recover.  Decreased step height and length noted.  After short rest in sitting he was able to stand and complete exercises in standing with one additional seated rest break due to fatigue.  Wife reported that he did not use a walker prior to admit.  He did well with a walker today and would recommend continuing to use it until strength and balance are increased.  Wife and he agree and are anticipating discharge to rehab when ready.   Follow Up Recommendations  SNF     Equipment Recommendations       Recommendations for Other Services       Precautions / Restrictions Precautions Precautions: Fall Restrictions Weight Bearing Restrictions: No    Mobility  Bed Mobility Overal bed mobility: Needs Assistance Bed Mobility: Supine to Sit     Supine to sit: Mod assist     General bed mobility comments: assist for upper body  Transfers Overall transfer level: Needs assistance Equipment used: Rolling walker (2 wheeled) Transfers: Sit to/from Stand Sit to Stand: Min guard         General transfer comment: does well with verbal cues for proper hand placements  Ambulation/Gait Ambulation/Gait assistance: Min guard Ambulation Distance (Feet): 25  Feet Assistive device: Rolling walker (2 wheeled) Gait Pattern/deviations: Step-through pattern;Decreased step length - right;Decreased step length - left   Gait velocity interpretation: <1.8 ft/sec, indicative of risk for recurrent falls General Gait Details: Used rolling walker today.  No LOB's but unsteady at times,  fatigues quickly   Stairs            Wheelchair Mobility    Modified Rankin (Stroke Patients Only)       Balance Overall balance assessment: Needs assistance Sitting-balance support: Feet supported Sitting balance-Leahy Scale: Good     Standing balance support: Bilateral upper extremity supported Standing balance-Leahy Scale: Fair                      Cognition Arousal/Alertness: Awake/alert Behavior During Therapy: WFL for tasks assessed/performed Overall Cognitive Status: Within Functional Limits for tasks assessed                      Exercises      General Comments        Pertinent Vitals/Pain Pain Assessment: No/denies pain    Home Living                      Prior Function            PT Goals (current goals can now be found in the care plan section)      Frequency  Min 2X/week      PT Plan Current plan remains appropriate    Co-evaluation             End of Session Equipment Utilized During Treatment: Gait belt Activity Tolerance: Patient limited by fatigue Patient left: in chair;with call bell/phone within reach;with chair alarm set;with family/visitor present     Time: 1443-1501 PT Time Calculation (min) (ACUTE ONLY): 18 min  Charges:  $Gait Training: 8-22 mins                    G Codes:      Chesley Noon April 27, 2016, 3:43 PM

## 2016-04-01 NOTE — Progress Notes (Signed)
[name], male   DOB: March 20, 1951, 65 y.o.   MRN: [id number]  [facility] PROGRESS NOTE  David Archer KDT:[id number] DOB: 1950-10-03 DOA: 03/30/2016 PCP: David Daisy, MD  HPI/Subjective: Patient feeling okay. Offers no complaints. No further fever. Patient was very weak with physical therapy. I spoke with the wife on the phone and she is unable to take care of him at home anymore.  Objective: Vitals:   04/01/16 0954 04/01/16 1339  BP: 98/62 104/62  Pulse: 74 76  Resp: 18 18  Temp: 97.9 F (36.6 C) 97.8 F (36.6 C)    Filed Weights   03/30/16 2319 03/31/16 0512 04/01/16 0500  Weight: 80.3 kg (177 lb) 80.4 kg (177 lb 5 oz) 80.7 kg (178 lb)    ROS: Review of Systems  Constitutional: Negative for chills and fever.  Eyes: Negative for blurred vision.  Respiratory: Negative for cough and shortness of breath.   Cardiovascular: Negative for chest pain.  Gastrointestinal: Negative for abdominal pain, constipation, diarrhea, nausea and vomiting.  Genitourinary: Negative for dysuria.  Musculoskeletal: Negative for joint pain.  Neurological: Negative for dizziness and headaches.   Exam: Physical Exam  HENT:  Nose: No mucosal edema.  Mouth/Throat: No oropharyngeal exudate or posterior oropharyngeal edema.  Eyes: Conjunctivae, EOM and lids are normal. Pupils are equal, round, and reactive to light.  Neck: No JVD present. Carotid bruit is not present. No edema present. No thyroid mass and no thyromegaly present.  Cardiovascular: S1 normal and S2 normal.  Exam reveals no gallop.   No murmur heard. Pulses:      Dorsalis pedis pulses are 2+ on the right side, and 2+ on the left side.  Respiratory: No respiratory distress. He has no wheezes. He has no rhonchi. He has no rales.  GI: Soft. Bowel sounds are normal. There is no tenderness.  Musculoskeletal:       Right ankle: He exhibits no swelling.       Left ankle: He exhibits no swelling.   Lymphadenopathy:    He has no cervical adenopathy.  Neurological: He is alert.  Skin: Skin is warm. No rash noted. Nails show no clubbing.  Slight skin redness on buttock sacral area. No signs of infection  Psychiatric: He has a normal mood and affect.      Data Reviewed: Basic Metabolic Panel:  Recent Labs Lab 03/26/16 1443 03/27/16 0703 03/30/16 2354 03/31/16 0523 04/01/16 0359  NA 133* 138 135 136 136  K 3.1* 2.9* 2.5* 3.2* 3.3*  CL 94* 105 98* 100* 102  CO2 '25 24 25 27 27  '$ GLUCOSE 99 77 110* 103* 95  BUN 22* '13 13 11 11  '$ CREATININE 0.86 0.63 0.95 0.85 0.76  CALCIUM 9.1 8.2* 8.3* 8.1* 7.3*  MG  --   --   --  1.2* 1.8   Liver Function Tests:  Recent Labs Lab 03/26/16 1443 03/27/16 0703 03/30/16 2354  AST '25 22 31  '$ ALT '21 20 24  '$ ALKPHOS 61 49 81  BILITOT 1.0 0.7 1.0  PROT 7.7 5.8* 6.5  ALBUMIN 3.5 2.6* 3.1*   CBC:  Recent Labs Lab 03/26/16 1443 03/27/16 0703 03/30/16 2354 04/01/16 0359  WBC 1.7* 2.7* 19.5* 18.9*  NEUTROABS 0.5*  --  14.2*  --   HGB 11.1* 7.1* 10.4* 8.5*  HCT 32.0* 20.6* 30.0* 25.3*  MCV 98.9 99.4 98.9 100.0  PLT 34* 26* 86* 90*   Cardiac Enzymes:  Recent Labs Lab 03/26/16 1443 03/30/16 2354  TROPONINI <0.03 0.04*    CBG:  Recent Labs Lab 03/31/16 1127 03/31/16 1655 03/31/16 2050 04/01/16 0737 04/01/16 1220  GLUCAP 94 108* 108* 84 90    Recent Results (from the past 240 hour(s))  C difficile quick scan w PCR reflex     Status: None   Collection Time: 03/27/16 12:00 AM  Result Value Ref Range Status   C Diff antigen NEGATIVE NEGATIVE Final   C Diff toxin NEGATIVE NEGATIVE Final   C Diff interpretation No C. difficile detected.  Final  Culture, blood (routine x 2)     Status: None (Preliminary result)   Collection Time: 03/30/16 11:54 PM  Result Value Ref Range Status   Specimen Description BLOOD RIGHT ASSIST CONTROL  Final   Special Requests   Final    BOTTLES DRAWN AEROBIC AND ANAEROBIC AER 9ML ANA 11ML    Culture NO GROWTH 1 DAY  Final   Report Status PENDING  Incomplete  Culture, blood (routine x 2)     Status: None (Preliminary result)   Collection Time: 03/30/16 11:54 PM  Result Value Ref Range Status   Specimen Description BLOOD  LEFT FOREARM  Final   Special Requests   Final    BOTTLES DRAWN AEROBIC AND ANAEROBIC  ANA 1ML AER 1ML   Culture NO GROWTH 1 DAY  Final   Report Status PENDING  Incomplete  Urine culture     Status: None   Collection Time: 03/31/16  8:24 AM  Result Value Ref Range Status   Specimen Description URINE, RANDOM  Final   Special Requests Normal  Final   Culture NO GROWTH Performed at [facility]   Final   Report Status 04/01/2016 FINAL  Final     Studies: Dg Chest 1 View  Result Date: 04/01/2016 CLINICAL DATA:  Fever, possible sepsis, diabetes mellitus EXAM: CHEST 1 VIEW COMPARISON:  Portable exam 1137 hours compared to 03/31/2016 FINDINGS: Normal heart size, mediastinal contours, and pulmonary vascularity. Atherosclerotic calcification aorta. Minimal subsegmental atelectasis RIGHT base. Lungs otherwise clear. No pleural effusion or pneumothorax. Bones unremarkable. IMPRESSION: Minimal subsegmental atelectasis RIGHT base. Aortic atherosclerosis. Electronically Signed   By: Lavonia Dana M.D.   On: 04/01/2016 11:52   Dg Chest 2 View  Result Date: 03/31/2016 CLINICAL DATA:  Fever on chemotherapy EXAM: CHEST  2 VIEW COMPARISON:  Chest CT 08/11/2015 FINDINGS: Cardiomediastinal contours are normal. No focal airspace consolidation or pulmonary edema. No pneumothorax or pleural effusion. IMPRESSION: No active cardiopulmonary disease. Electronically Signed   By: Ulyses Jarred M.D.   On: 03/31/2016 00:39    Scheduled Meds: . amiodarone  200 mg Oral Daily  . docusate sodium  100 mg Oral BID  . feeding supplement (ENSURE ENLIVE)  237 mL Oral QID  . heparin  5,000 Units Subcutaneous Q8H  . insulin aspart  0-15 Units Subcutaneous TID WC  . [START ON 04/02/2016]  metoprolol succinate  25 mg Oral Daily  . nortriptyline  10 mg Oral QHS  . potassium chloride  20 mEq Oral Daily  . rosuvastatin  10 mg Oral Daily  . sodium chloride flush  3 mL Intravenous Q12H      Assessment/Plan:  1. Fever, initial tachycardia and leukocytosis. Admitting physician thought this may be sepsis but I do not have a source of fever at this point. Stop antibiotics and see what happens. Monitor for further fever. 2. Relative hypotension. Decrease metoprolol to 25 mg. Hopefully can stop IV fluids today. 3. Weakness. Physical therapy  recommends rehabilitation 4. 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 Overall prognosis is poor. Patient still a full code. Spoke with the wife on the phone. I did place a pound of care consultation. 5. Severe hypomagnesemia. Replace 2 g IV magnesium today 6. Severe hypokalemia on presentation. Potassium better. Oral potassium replacement 7. Atrial fibrillation history on amiodarone and decreased dose of metoprolol 8. Stop statin  Code Status:     Code Status Orders        Start     Ordered   03/31/16 0520  Full code  Continuous     03/31/16 0519    Code Status History    Date Active Date Inactive Code Status Order ID Comments User Context   03/26/2016  8:06 PM 03/27/2016  4:56 PM Full Code [id number]  Nicholes Mango, MD Inpatient   12/29/2015  1:21 AM 12/31/2015  4:03 PM Full Code [id number]  Lance Coon, MD Inpatient   12/16/2015  7:14 PM 12/19/2015  5:07 PM Full Code [id number]  Newman Pies, MD Inpatient    Advance Directive Documentation   Flowsheet Row Most Recent Value  Type of Advance Directive  Healthcare Power of Attorney, Living will  Pre-existing out of facility DNR order (yellow form or pink MOST form)  No data  "MOST" Form in Place?  No data     Family Communication: Spoke with wife on the phone Disposition Plan: Will go to [place] over the weekend  Antibiotics: - None  Time spent: 25 minutes  Loletha Grayer  [employer]

## 2016-04-01 NOTE — Clinical Social Work Placement (Signed)
   CLINICAL SOCIAL WORK PLACEMENT  NOTE  Date:  04/01/2016  Patient Details  Name: David Archer MRN: [id number] Date of Birth: [birth year]  Clinical Social Work is seeking post-discharge placement for this patient at the [place] level of care (*CSW will initial, date and re-position this form in  chart as items are completed):  Yes   Patient/family provided with [place] Work Department's list of facilities offering this level of care within the geographic area requested by the patient (or if unable, by the patient's family).  Yes   Patient/family informed of their freedom to choose among providers that offer the needed level of care, that participate in Medicare, Medicaid or managed care program needed by the patient, have an available bed and are willing to accept the patient.  Yes   Patient/family informed of [facility]'s ownership interest in [facility] and [facility], as well as of the fact that they are under no obligation to receive care at these facilities.  PASRR submitted to EDS on 04/01/16     PASRR number received on 04/01/16     Existing PASRR number confirmed on       FL2 transmitted to all facilities in geographic area requested by pt/family on 04/01/16     FL2 transmitted to all facilities within larger geographic area on       Patient informed that his/her managed care company has contracts with or will negotiate with certain facilities, including the following:        Yes   Patient/family informed of bed offers received.  Patient chooses bed at [facility]     Physician recommends and patient chooses bed at      Patient to be transferred to   on  .  Patient to be transferred to facility by       Patient family notified on   of transfer.  Name of family member notified:        PHYSICIAN Please sign FL2     Additional Comment:    _______________________________________________ [name] 04/01/2016, 6:36 PM

## 2016-04-01 NOTE — Clinical Social Work Note (Addendum)
Clinical Social Work Assessment  Patient Details  Name: David Archer MRN: [id number] Date of Birth: 08-23-1950  Date of referral:  04/01/16               Reason for consult:  Facility Placement                Permission sought to share information with:  Facility Sport and exercise psychologist, Family Supports Permission granted to share information::  Yes, Verbal Permission Granted  Name::     David Archer [phone]   Agency::  SNF admissions  Relationship::     Contact Information:     Housing/Transportation Living arrangements for the past 2 months:  [place] of Information:  Patient Patient Interpreter Needed:  None Criminal Activity/Legal Involvement Pertinent to Current Situation/Hospitalization:  No - Comment as needed Significant Relationships:  Spouse Lives with:  Spouse Do you feel safe going back to the place where you live?  No Need for family participation in patient care:  No (Coment)  Care giving concerns:  Patient feels he needs some short term rehab before he is able to return back home.   Social Worker assessment / plan:  Patient is a married 65 year old male who lives with his wife.  Patient is alert and oriented x4 and able to express his feelings.  Patient states that he has not been to rehab before MSW explained to patient what to expect and how insurance will pay for his stay at [facility].  Patient was explained role of MSW and process of looking for bed placement for short term rehab.  Patient expressed he did not have any other questions and gave MSW permission to begin bed search in [facility].  Employment status:  Retired Forensic scientist:  Managed Care PT Recommendations:  [place] / Referral to community resources:  [place]  Patient/Family's Response to care:  Patient in agreement to going to SNF for short term rehab.  Patient/Family's Understanding of and Emotional  Response to Diagnosis, Current Treatment, and Prognosis:  Patient stated he hopes he does not have to be at SNF for a long period of time.  He is motivated to work hard to get his strength back.  Emotional Assessment Appearance:  Appears stated age Attitude/Demeanor/Rapport:    Affect (typically observed):  Appropriate, Calm, Stable Orientation:  Oriented to Self, Oriented to Place, Oriented to  Time, Oriented to Situation Alcohol / Substance use:  Not Applicable Psych involvement (Current and /or in the community):  No (Comment)  Discharge Needs  Concerns to be addressed:  Lack of Support Readmission within the last 30 days:  Yes 03/27/16 to home Current discharge risk:  Lack of support system Barriers to Discharge:  Continued Medical Work up, [employer]   [name] 04/01/2016, 6:32 PM

## 2016-04-02 DIAGNOSIS — K610 Anal abscess: Principal | ICD-10-CM

## 2016-04-02 LAB — BASIC METABOLIC PANEL
Anion gap: 7 (ref 5–15)
BUN: 12 mg/dL (ref 6–20)
CHLORIDE: 104 mmol/L (ref 101–111)
CO2: 25 mmol/L (ref 22–32)
Calcium: 7.7 mg/dL — ABNORMAL LOW (ref 8.9–10.3)
Creatinine, Ser: 0.58 mg/dL — ABNORMAL LOW (ref 0.61–1.24)
GFR calc Af Amer: >60 mL/min (ref >60)
GFR calc non Af Amer: >60 mL/min (ref >60)
Glucose, Bld: 79 mg/dL (ref 65–99)
POTASSIUM: 2.9 mmol/L — AB (ref 3.5–5.1)
SODIUM: 136 mmol/L (ref 135–145)

## 2016-04-02 LAB — GLUCOSE, CAPILLARY
Glucose-Capillary: 77 mg/dL (ref 65–99)
Glucose-Capillary: 111 mg/dL — ABNORMAL HIGH (ref 65–99)
Glucose-Capillary: 163 mg/dL — ABNORMAL HIGH (ref 65–99)
Glucose-Capillary: 203 mg/dL — ABNORMAL HIGH (ref 65–99)

## 2016-04-02 LAB — CBC
HEMATOCRIT: 24.7 % — AB (ref 40.0–52.0)
HEMOGLOBIN: 8.4 g/dL — AB (ref 13.0–18.0)
MCH: 34.2 pg — AB (ref 26.0–34.0)
MCHC: 34.2 g/dL (ref 32.0–36.0)
MCV: 100 fL (ref 80.0–100.0)
Platelets: 125 10*3/uL — ABNORMAL LOW (ref 150–440)
RBC: 2.47 MIL/uL — AB (ref 4.40–5.90)
RDW: 17.2 % — ABNORMAL HIGH (ref 11.5–14.5)
WBC: 15.5 10*3/uL — ABNORMAL HIGH (ref 3.8–10.6)

## 2016-04-02 LAB — MAGNESIUM: MAGNESIUM: 1.9 mg/dL (ref 1.7–2.4)

## 2016-04-02 MED ORDER — NYSTATIN 100000 UNIT/GM EX POWD
Freq: Two times a day (BID) | CUTANEOUS | Status: DC
  Administered 2016-04-02 – 2016-04-03 (×3): via TOPICAL
  Filled 2016-04-02: qty 15

## 2016-04-02 MED ORDER — ENSURE ENLIVE PO LIQD: 237.0000 mL | Freq: Four times a day (QID) | ORAL | 12 refills | Status: AC

## 2016-04-02 MED ORDER — AMOXICILLIN-POT CLAVULANATE 875-125 MG PO TABS
1.0000 | ORAL_TABLET | Freq: Two times a day (BID) | ORAL | Status: DC
  Administered 2016-04-02 – 2016-04-03 (×3): 1 via ORAL
  Filled 2016-04-02 (×3): qty 1

## 2016-04-02 MED ORDER — BISACODYL 10 MG RE SUPP
10.0000 mg | Freq: Once | RECTAL | Status: AC
  Administered 2016-04-02: 13:00:00 10 mg via RECTAL
  Filled 2016-04-02: qty 1

## 2016-04-02 MED ORDER — POTASSIUM CHLORIDE 20 MEQ PO PACK
40.0000 meq | PACK | ORAL | Status: AC
  Administered 2016-04-02 – 2016-04-03 (×4): 40 meq via ORAL
  Filled 2016-04-02 (×2): qty 2

## 2016-04-02 MED ORDER — DOCUSATE SODIUM 100 MG PO CAPS: 100.0000 mg | ORAL_CAPSULE | Freq: Two times a day (BID) | ORAL | 0 refills | Status: AC

## 2016-04-02 MED ORDER — SENNA 8.6 MG PO TABS: 2.0000 | ORAL_TABLET | Freq: Every day | ORAL | 0 refills | Status: AC

## 2016-04-02 MED ORDER — LIDOCAINE-EPINEPHRINE (PF) 1 %-1:200000 IJ SOLN: 20.0000 mL | Freq: Once | INTRAMUSCULAR | Status: DC

## 2016-04-02 MED ORDER — POTASSIUM CHLORIDE 20 MEQ PO PACK: 40.0000 meq | PACK | ORAL | 0 refills | Status: DC

## 2016-04-02 MED ORDER — HYDROCODONE-ACETAMINOPHEN 5-325 MG PO TABS: 1.0000 | ORAL_TABLET | ORAL | 0 refills | Status: AC | PRN

## 2016-04-02 MED ORDER — POLYETHYLENE GLYCOL 3350 17 G PO PACK
17.0000 g | PACK | Freq: Once | ORAL | Status: AC
  Administered 2016-04-02: 13:00:00 17 g via ORAL
  Filled 2016-04-02: qty 1

## 2016-04-02 MED ORDER — LIDOCAINE HCL (PF) 1 % IJ SOLN
30.0000 mL | Freq: Once | INTRAMUSCULAR | Status: AC
Start: 2016-04-02 — End: 2016-04-02
  Administered 2016-04-02: 10 mL via INTRADERMAL
  Filled 2016-04-02: qty 30

## 2016-04-02 MED ORDER — SENNA 8.6 MG PO TABS
2.0000 | ORAL_TABLET | Freq: Once | ORAL | Status: AC
Start: 2016-04-02 — End: 2016-04-02
  Administered 2016-04-02: 17.2 mg via ORAL
  Filled 2016-04-02: qty 2

## 2016-04-02 MED ORDER — AMOXICILLIN-POT CLAVULANATE 875-125 MG PO TABS: 1.0000 | ORAL_TABLET | Freq: Two times a day (BID) | ORAL | 0 refills | Status: AC

## 2016-04-02 MED ORDER — MEGESTROL ACETATE 400 MG/10ML PO SUSP: 800.0000 mg | Freq: Every day | ORAL | 0 refills | Status: AC

## 2016-04-02 NOTE — Progress Notes (Signed)
Assisted MD at bedside after informed consent obtained with I&D performed on abscess at right perianal area- copious amount of foul smelling greenish gray purulent drainage releases with packing inserted by MD. Loose dsg place over site and covered with disposable underwear to hold in place. Pt tolerated procedure well and states feels improved. Wife present in room during procedure.

## 2016-04-02 NOTE — Consult Note (Signed)
Date of Consultation:  04/02/2016  Requesting Physician:  Dr. Dustin Flock  Reason for Consultation:  Buttock abscess  History of Present Illness: David Archer is a 65 y.o. male who was admitted on 11/29 with fever in the setting of chemotherapy and radiation. He has a history of metastatic small cell lung cancer with metastasis to the brain. This admission, his source of fever has not been found and he was initially on empiric antibiotics which had been stopped yesterday. Today however his nurse noted an area of redness around the buttocks while the patient was about to get a suppository for constipation.  The patient reports that he has been having an area of tenderness and swelling on the right buttocks for 1 week, which has been getting more tender each day.  He denies seeing any drainage or blood staining in his underwear.  He had diarrhea initially when the tenderness started, which was followed by some constipation, but today had some diarrhea after getting a suppository.  Other than the fevers, denies any abdominal pain, chest pain, shortness of breath, nausea, vomiting.  Past Medical History: Past Medical History:  Diagnosis Date  . Arthritis   . Brain tumor ([place]) 8-9/17   unsure if cancerous at this time  . Diabetes mellitus without complication (HCC)    borderline  . Dysrhythmia    tachycardia  . GERD (gastroesophageal reflux disease)    takes OTC MEDS  . Headache   . Kidney stones    cant remember when  . Pre-diabetes   . Sleep apnea    back in 2000.  Hasn't been retested  . Tachycardia      Past Surgical History: Past Surgical History:  Procedure Laterality Date  . APPLICATION OF CRANIAL NAVIGATION N/A 08/17/2015   Procedure: APPLICATION OF CRANIAL NAVIGATION;  Surgeon: Newman Pies, MD;  Location: [place] NEURO ORS;  Service: Neurosurgery;  Laterality: N/A;  . APPLICATION OF CRANIAL NAVIGATION N/A 12/16/2015   Procedure: APPLICATION OF CRANIAL NAVIGATION;   Surgeon: Newman Pies, MD;  Location: [place] NEURO ORS;  Service: Neurosurgery;  Laterality: N/A;  . COLONOSCOPY    . CRANIOTOMY Left 12/16/2015   Procedure: CRANIOTOMY LEFT FRONTAL;  Surgeon: Newman Pies, MD;  Location: [place] NEURO ORS;  Service: Neurosurgery;  Laterality: Left;  . HAS NEVER HAD     HAS NEVER HAD ANESTHESIA--NO SURGERIES  . PR DURAL GRAFT REPAIR,SPINE DEFECT Left 08/17/2015   Procedure: STEREOTACTIC BRAIN BIOPSY WITH [place] NAVIGATION;  Surgeon: Newman Pies, MD;  Location: [place] NEURO ORS;  Service: Neurosurgery;  Laterality: Left;  Stereotactic brain biopsy with brainlab    Home Medications: Prior to Admission medications   Medication Sig Start Date End Date Taking? Authorizing Provider  amiodarone (PACERONE) 200 MG tablet Take 1 tablet (200 mg total) by mouth daily. 03/27/16  Yes Sital Mody, MD  fluticasone (FLONASE) 50 MCG/ACT nasal spray Place 2 sprays into both nostrils daily as needed for allergies or rhinitis.   Yes Historical Provider, MD  insulin aspart (NOVOLOG) 100 UNIT/ML injection Inject 6 Units into the skin 3 (three) times daily before meals.   Yes Historical Provider, MD  insulin glargine (LANTUS) 100 UNIT/ML injection Inject 0.1 mLs (10 Units total) into the skin at bedtime. 03/27/16  Yes Bettey Costa, MD  metoprolol succinate (TOPROL-XL) 100 MG 24 hr tablet Take 1 tablet (100 mg total) by mouth daily. Take with or immediately following a meal. 08/20/15  Yes Newman Pies, MD  nortriptyline (PAMELOR) 10 MG capsule Take 10  mg by mouth at bedtime.    Yes Historical Provider, MD  rosuvastatin (CRESTOR) 10 MG tablet Take 10 mg by mouth daily.   Yes Historical Provider, MD  amoxicillin-clavulanate (AUGMENTIN) 875-125 MG tablet Take 1 tablet by mouth every 12 (twelve) hours. 04/02/16 04/09/16  Dustin Flock, MD  docusate sodium (COLACE) 100 MG capsule Take 1 capsule (100 mg total) by mouth 2 (two) times daily. 04/02/16   Dustin Flock, MD  feeding supplement, ENSURE  ENLIVE, (ENSURE ENLIVE) LIQD Take 237 mLs by mouth 4 (four) times daily. 04/02/16   Dustin Flock, MD  HYDROcodone-acetaminophen (NORCO/VICODIN) 5-325 MG tablet Take 1-2 tablets by mouth every 4 (four) hours as needed for moderate pain. 04/02/16   Dustin Flock, MD  megestrol (MEGACE) 400 MG/10ML suspension Take 20 mLs (800 mg total) by mouth daily. 04/02/16   Dustin Flock, MD  potassium chloride (KLOR-CON) 20 MEQ packet Take 40 mEq by mouth every 4 (four) hours. 04/02/16 04/03/16  Dustin Flock, MD  senna (SENOKOT) 8.6 MG TABS tablet Take 2 tablets (17.2 mg total) by mouth daily. 04/02/16   Dustin Flock, MD    Allergies: No Known Allergies  Social History:  reports that he quit smoking about 7 years ago. His smoking use included Cigarettes. He has a 70.00 pack-year smoking history. He quit smokeless tobacco use about 7 years ago. He reports that he does not drink alcohol or use drugs.   Family History: History reviewed. No pertinent family history.  Review of Systems: Review of Systems  Constitutional: Positive for fever. Negative for chills.  HENT: Negative for hearing loss.   Eyes: Negative for blurred vision.  Respiratory: Negative for cough and shortness of breath.   Cardiovascular: Negative for chest pain and leg swelling.  Gastrointestinal: Negative for abdominal pain, blood in stool, heartburn, nausea and vomiting.       Tenderness over right buttocks, close to anal opening  Genitourinary: Negative for dysuria and hematuria.  Musculoskeletal: Negative for myalgias.  Skin: Negative for rash.  Neurological: Negative for dizziness.  Psychiatric/Behavioral: Negative for depression.    Physical Exam BP 108/69 (BP Location: Right Arm)   Pulse 89   Temp 97.7 F (36.5 C) (Oral)   Resp 20   Ht '5\' 11"'$  (1.803 m)   Wt 80.3 kg (177 lb)   SpO2 97%   BMI 24.69 kg/m  CONSTITUTIONAL: No acute distress. HEENT:  Normocephalic, atraumatic, extraocular motion intact. NECK: Trachea is  midline, and there is no jugular venous distension. RESPIRATORY:  Lungs are clear, and breath sounds are equal bilaterally. Normal respiratory effort without pathologic use of accessory muscles. CARDIOVASCULAR: Heart is regular without murmurs, gallops, or rubs. GI: The abdomen is soft, nondistended, nontender. There were no palpable masses. There was no hepatosplenomegaly. Rectal:  No palpable rectal masses.  No gross blood.  Patient has an abscess at right lateral position, next to the anal verge.  No palpable lesions on anal canal.  No active drainage. MUSCULOSKELETAL:  Normal muscle strength and tone in all four extremities.  No peripheral edema or cyanosis. SKIN: Skin turgor is normal. There are no pathologic skin lesions.  NEUROLOGIC:  Motor and sensation is grossly normal.  Cranial nerves are grossly intact. PSYCH:  Alert and oriented to person, place and time. Affect is normal.  Laboratory Analysis: Results for orders placed or performed during the hospital encounter of 03/30/16 (from the past 24 hour(s))  Glucose, capillary     Status: None   Collection Time: 04/01/16  4:34  PM  Result Value Ref Range   Glucose-Capillary 83 65 - 99 mg/dL  Glucose, capillary     Status: None   Collection Time: 04/01/16  8:45 PM  Result Value Ref Range   Glucose-Capillary 92 65 - 99 mg/dL  CBC     Status: Abnormal   Collection Time: 04/02/16  4:24 AM  Result Value Ref Range   WBC 15.5 (H) 3.8 - 10.6 K/uL   RBC 2.47 (L) 4.40 - 5.90 MIL/uL   Hemoglobin 8.4 (L) 13.0 - 18.0 g/dL   HCT 24.7 (L) 40.0 - 52.0 %   MCV 100.0 80.0 - 100.0 fL   MCH 34.2 (H) 26.0 - 34.0 pg   MCHC 34.2 32.0 - 36.0 g/dL   RDW 17.2 (H) 11.5 - 14.5 %   Platelets 125 (L) 150 - 440 K/uL  Basic metabolic panel     Status: Abnormal   Collection Time: 04/02/16  4:24 AM  Result Value Ref Range   Sodium 136 135 - 145 mmol/L   Potassium 2.9 (L) 3.5 - 5.1 mmol/L   Chloride 104 101 - 111 mmol/L   CO2 25 22 - 32 mmol/L   Glucose,  Bld 79 65 - 99 mg/dL   BUN 12 6 - 20 mg/dL   Creatinine, Ser 0.58 (L) 0.61 - 1.24 mg/dL   Calcium 7.7 (L) 8.9 - 10.3 mg/dL   GFR calc non Af Amer >60 >60 mL/min   GFR calc Af Amer >60 >60 mL/min   Anion gap 7 5 - 15  Magnesium     Status: None   Collection Time: 04/02/16  4:24 AM  Result Value Ref Range   Magnesium 1.9 1.7 - 2.4 mg/dL  Glucose, capillary     Status: None   Collection Time: 04/02/16  7:31 AM  Result Value Ref Range   Glucose-Capillary 77 65 - 99 mg/dL  Glucose, capillary     Status: Abnormal   Collection Time: 04/02/16 11:35 AM  Result Value Ref Range   Glucose-Capillary 111 (H) 65 - 99 mg/dL    Imaging: No results found.  Assessment and Plan: This is a 65 y.o. male who presents with a right perianal abscess.  Will proceed with incision and drainage of abscess at bedside.  The risks and benefits of the procedure were explained to the patient and he was willing to proceed.  Consent has been signed.  He will have a 1/4 inch nugauze wick in place with fluff gauze and ABD pad.  The wick may come off tomorrow if it does not fall off on its own.  Patient will need Sitz baths twice daily and after each bowel movement and continue po antibiotics for a 7 day course.  Patient may follow up in the office 1 week after discharge.   Melvyn Neth, [place] 7a-7p:  [place] 7p-7a:  [phone]

## 2016-04-02 NOTE — Progress Notes (Signed)
No BM x 6 days with cdiff enteric precautions discontinued. Pt was given senna, miralax and ducolax supp with excellent results. Wife reported pt has had sore place near rectum since prior to admission. Noted elevated, circular, firm, tender nodule proximal right buttock at perianal area with scab with no active drainage. MD notified and assessed. Discharge cancelled until this area can be further evaluated. Wife in and active in pt care.Up to BR with 1+ and tolerated well. Pain controlled with norco related wound area of possible abscess. Augmentin therapy continued.

## 2016-04-02 NOTE — Discharge Summary (Signed)
[place] at [facility], 65 y.o., DOB [birth year], MRN [id number]. 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 The patient had just been in the hospital 4 days prior to his ER visit with pancytopenia. Today he is currently 14 days out from chemotherapy and Neulasta treatment. Penicillin initially was admitted for sepsis however no source of infection was identified. Patient blood cultures and urine cultures have been negative. His been afebrile antibiotics have been stopped. His fever could've been related to his cancer. Patient is very deconditioned and is being arranged to go to rehabilitation. Currently his potassium is being replaced he'll need follow-up on his potassium in 2 days at the skilled nursing facility. Patient has had lack of appetite he'll be started on Megace to see if that would help with his appetite.           Consults  None  Significant Tests:  See full reports for all details    Dg Chest 1 View  Result Date: 04/01/2016 CLINICAL DATA:  Fever, possible sepsis, diabetes mellitus EXAM: CHEST 1 VIEW COMPARISON:  Portable exam 1137 hours compared to 03/31/2016 FINDINGS: Normal heart size, mediastinal  contours, and pulmonary vascularity. Atherosclerotic calcification aorta. Minimal subsegmental atelectasis RIGHT base. Lungs otherwise clear. No pleural effusion or pneumothorax. Bones unremarkable. IMPRESSION: Minimal subsegmental atelectasis RIGHT base. Aortic atherosclerosis. Electronically Signed   By: Lavonia Dana M.D.   On: 04/01/2016 11:52   Dg Chest 2 View  Result Date: 03/31/2016 CLINICAL DATA:  Fever on chemotherapy EXAM: CHEST  2 VIEW COMPARISON:  Chest CT 08/11/2015 FINDINGS: Cardiomediastinal contours are normal. No focal airspace consolidation or pulmonary edema. No pneumothorax or pleural effusion. IMPRESSION: No active cardiopulmonary disease. Electronically Signed   By: Ulyses Jarred M.D.   On: 03/31/2016 00:39       Today   Subjective:   David Archer  denies any symptoms feels well  Objective:   Blood pressure 106/65, pulse 82, temperature 98.3 F (36.8 C), temperature source Oral, resp. rate 20, height '5\' 11"'$  (1.803 m), weight 177 lb (80.3 kg), SpO2 97 %.  .  Intake/Output Summary (Last 24 hours) at 04/02/16 1312 Last data filed at 04/02/16 0534  Gross per 24 hour  Intake               50 ml  Output              500 ml  Net             -450 ml    Exam VITAL SIGNS: Blood pressure 106/65, pulse 82, temperature 98.3 F (36.8 C), temperature source Oral, resp. rate 20, height '5\' 11"'$  (1.803 m), weight 177 lb (80.3 kg), SpO2 97 %.  GENERAL:  65 y.o.-year-old patient lying in the bed with no acute distress.  EYES: Pupils equal, round, reactive to light and accommodation.  No scleral icterus. Extraocular muscles intact.  HEENT: Head atraumatic, normocephalic. Oropharynx and nasopharynx clear.  NECK:  Supple, no jugular venous distention. No thyroid enlargement, no tenderness.  LUNGS: Normal breath sounds bilaterally, no wheezing, rales,rhonchi or crepitation. No use of accessory muscles of respiration.  CARDIOVASCULAR: S1, S2 normal. No murmurs, rubs, or gallops.   ABDOMEN: Soft, nontender, nondistended. Bowel sounds present. No organomegaly or mass.  EXTREMITIES: No pedal edema, cyanosis, or clubbing.  NEUROLOGIC: Cranial nerves II through XII are intact. Muscle strength 5/5 in all extremities. Sensation intact. Gait not checked.  PSYCHIATRIC: The patient is alert and oriented x 3.  SKIN: No obvious rash, lesion, or ulcer.   Data Review     CBC w Diff: Lab Results  Component Value Date   WBC 15.5 (H) 04/02/2016   HGB 8.4 (L) 04/02/2016   HCT 24.7 (L) 04/02/2016   PLT 125 (L) 04/02/2016   LYMPHOPCT 18 03/30/2016   BANDSPCT 11 03/30/2016   MONOPCT 9 03/30/2016   EOSPCT 0 03/30/2016   BASOPCT 0 03/30/2016   CMP: Lab Results  Component Value Date   NA 136 04/02/2016   K 2.9 (L) 04/02/2016   CL 104 04/02/2016   CO2 25 04/02/2016   BUN 12 04/02/2016   CREATININE 0.58 (L) 04/02/2016   PROT 6.5 03/30/2016   ALBUMIN 3.1 (L) 03/30/2016   BILITOT 1.0 03/30/2016   ALKPHOS 81 03/30/2016   AST 31 03/30/2016   ALT 24 03/30/2016  .  Micro Results Recent Results (from the past 240 hour(s))  C difficile quick scan w PCR reflex     Status: None   Collection Time: 03/27/16 12:00 AM  Result Value Ref Range Status   C Diff antigen NEGATIVE NEGATIVE Final   C Diff toxin NEGATIVE NEGATIVE Final   C Diff interpretation No C. difficile detected.  Final  Culture, blood (routine x 2)     Status: None (Preliminary result)   Collection Time: 03/30/16 11:54 PM  Result Value Ref Range Status   Specimen Description BLOOD RIGHT ASSIST CONTROL  Final   Special Requests   Final    BOTTLES DRAWN AEROBIC AND ANAEROBIC AER 9ML ANA 11ML   Culture NO GROWTH 2 DAYS  Final   Report Status PENDING  Incomplete  Culture, blood (routine x 2)     Status: None (Preliminary result)   Collection Time: 03/30/16 11:54 PM  Result Value Ref Range Status   Specimen Description BLOOD  LEFT FOREARM  Final   Special Requests   Final    BOTTLES DRAWN AEROBIC AND ANAEROBIC   ANA 1ML AER 1ML   Culture NO GROWTH 2 DAYS  Final   Report Status PENDING  Incomplete  Urine culture     Status: None   Collection Time: 03/31/16  8:24 AM  Result Value Ref Range Status   Specimen Description URINE, RANDOM  Final   Special Requests Normal  Final   Culture NO GROWTH Performed at [facility]   Final   Report Status 04/01/2016 FINAL  Final        Code Status Orders        Start     Ordered   03/31/16 0520  Full code  Continuous     03/31/16 0519    Code Status History    Date Active Date Inactive Code Status Order ID Comments User Context   03/26/2016  8:06 PM 03/27/2016  4:56 PM Full Code [id number]  Nicholes Mango, MD Inpatient  12/29/2015  1:21 AM 12/31/2015  4:03 PM Full Code [id number]  Lance Coon, MD Inpatient   12/16/2015  7:14 PM 12/19/2015  5:07 PM Full Code [id number]  Newman Pies, MD Inpatient    Advance Directive Documentation   Flowsheet Row Most Recent Value  Type of Advance Directive  Healthcare Power of Attorney, Living will  Pre-existing out of facility DNR order (yellow form or pink MOST form)  No data  "MOST" Form in Place?  No data           Contact information for follow-up providers    Sherrin Daisy, MD Follow up in 2 week(s).   Specialty:  Family Medicine Contact information: [place] [phone]            Contact information for after-discharge care    [facility] Follow up.   Specialty:  [place] information: [place] [phone]                  Discharge Medications     Medication List    TAKE these medications   amiodarone 200 MG tablet Commonly known as:  PACERONE Take 1 tablet (200 mg total) by mouth daily.   docusate sodium 100 MG capsule Commonly known as:  COLACE Take 1 capsule (100 mg total) by mouth 2 (two) times daily.   feeding supplement (ENSURE  ENLIVE) Liqd Take 237 mLs by mouth 4 (four) times daily.   fluticasone 50 MCG/ACT nasal spray Commonly known as:  FLONASE Place 2 sprays into both nostrils daily as needed for allergies or rhinitis.   HYDROcodone-acetaminophen 5-325 MG tablet Commonly known as:  NORCO/VICODIN Take 1-2 tablets by mouth every 4 (four) hours as needed for moderate pain.   insulin aspart 100 UNIT/ML injection Commonly known as:  novoLOG Inject 6 Units into the skin 3 (three) times daily before meals.   insulin glargine 100 UNIT/ML injection Commonly known as:  LANTUS Inject 0.1 mLs (10 Units total) into the skin at bedtime.   megestrol 400 MG/10ML suspension Commonly known as:  MEGACE Take 20 mLs (800 mg total) by mouth daily.   metoprolol succinate 100 MG 24 hr tablet Commonly known as:  TOPROL-XL Take 1 tablet (100 mg total) by mouth daily. Take with or immediately following a meal.   nortriptyline 10 MG capsule Commonly known as:  PAMELOR Take 10 mg by mouth at bedtime.   potassium chloride 20 MEQ packet Commonly known as:  KLOR-CON Take 40 mEq by mouth every 4 (four) hours.   rosuvastatin 10 MG tablet Commonly known as:  CRESTOR Take 10 mg by mouth daily.   senna 8.6 MG Tabs tablet Commonly known as:  SENOKOT Take 2 tablets (17.2 mg total) by mouth daily.          Total Time in preparing paper work, data evaluation and todays exam - 35 minutes  Dustin Flock M.D on 04/02/2016 at 1:12 PM  [facility]   Office  [phone]

## 2016-04-02 NOTE — Progress Notes (Addendum)
[name], male   DOB: 02/24/1951, 65 y.o.   MRN: [id number]  [facility] PROGRESS NOTE  David Archer:[id number] DOB: 1950-11-02 DOA: 03/30/2016 PCP: Sherrin Daisy, MD  HPI/Subjective: Patient was having constipation side had the nurse giving him a rectal suppository and she noticed a area of erythema and his buttocks   Objective: Vitals:   04/02/16 0411 04/02/16 1016  BP: 108/71 106/65  Pulse: 72 82  Resp: 17 20  Temp: 97.7 F (36.5 C) 98.3 F (36.8 C)    Filed Weights   03/31/16 0512 04/01/16 0500 04/02/16 0451  Weight: 177 lb 5 oz (80.4 kg) 178 lb (80.7 kg) 177 lb (80.3 kg)    ROS: Review of Systems  Constitutional: Negative for chills and fever.  Eyes: Negative for blurred vision.  Respiratory: Negative for cough and shortness of breath.   Cardiovascular: Negative for chest pain.  Gastrointestinal: Negative for abdominal pain, constipation, diarrhea, nausea and vomiting.  Genitourinary: Negative for dysuria.  Musculoskeletal: Negative for joint pain.  Neurological: Negative for dizziness and headaches.   Exam: Physical Exam  HENT:  Nose: No mucosal edema.  Mouth/Throat: No oropharyngeal exudate or posterior oropharyngeal edema.  Eyes: Conjunctivae, EOM and lids are normal. Pupils are equal, round, and reactive to light.  Neck: No JVD present. Carotid bruit is not present. No edema present. No thyroid mass and no thyromegaly present.  Cardiovascular: S1 normal and S2 normal.  Exam reveals no gallop.   No murmur heard. Pulses:      Dorsalis pedis pulses are 2+ on the right side, and 2+ on the left side.  Respiratory: No respiratory distress. He has no wheezes. He has no rhonchi. He has no rales.  GI: Soft. Bowel sounds are normal. There is no tenderness.  Genitourinary:  Genitourinary Comments: Area of tenderness and erythema near the rectal canal approximately 2 cm  Musculoskeletal:       Right ankle: He exhibits no swelling.        Left ankle: He exhibits no swelling.  Lymphadenopathy:    He has no cervical adenopathy.  Neurological: He is alert.  Skin: Skin is warm. No rash noted. Nails show no clubbing.  Slight skin redness on buttock sacral area. No signs of infection  Psychiatric: He has a normal mood and affect.      Data Reviewed: Basic Metabolic Panel:  Recent Labs Lab 03/27/16 0703 03/30/16 2354 03/31/16 0523 04/01/16 0359 04/02/16 0424  NA 138 135 136 136 136  K 2.9* 2.5* 3.2* 3.3* 2.9*  CL 105 98* 100* 102 104  CO2 '24 25 27 27 25  '$ GLUCOSE 77 110* 103* 95 79  BUN '13 13 11 11 12  '$ CREATININE 0.63 0.95 0.85 0.76 0.58*  CALCIUM 8.2* 8.3* 8.1* 7.3* 7.7*  MG  --   --  1.2* 1.8 1.9   Liver Function Tests:  Recent Labs Lab 03/26/16 1443 03/27/16 0703 03/30/16 2354  AST '25 22 31  '$ ALT '21 20 24  '$ ALKPHOS 61 49 81  BILITOT 1.0 0.7 1.0  PROT 7.7 5.8* 6.5  ALBUMIN 3.5 2.6* 3.1*   CBC:  Recent Labs Lab 03/26/16 1443 03/27/16 0703 03/30/16 2354 04/01/16 0359 04/02/16 0424  WBC 1.7* 2.7* 19.5* 18.9* 15.5*  NEUTROABS 0.5*  --  14.2*  --   --   HGB 11.1* 7.1* 10.4* 8.5* 8.4*  HCT 32.0* 20.6* 30.0* 25.3* 24.7*  MCV 98.9 99.4 98.9 100.0 100.0  PLT 34* 26* 86* 90*  125*   Cardiac Enzymes:  Recent Labs Lab 03/26/16 1443 03/30/16 2354  TROPONINI <0.03 0.04*    CBG:  Recent Labs Lab 04/01/16 1220 04/01/16 1634 04/01/16 2045 04/02/16 0731 04/02/16 1135  GLUCAP 90 83 92 77 111*    Recent Results (from the past 240 hour(s))  C difficile quick scan w PCR reflex     Status: None   Collection Time: 03/27/16 12:00 AM  Result Value Ref Range Status   C Diff antigen NEGATIVE NEGATIVE Final   C Diff toxin NEGATIVE NEGATIVE Final   C Diff interpretation No C. difficile detected.  Final  Culture, blood (routine x 2)     Status: None (Preliminary result)   Collection Time: 03/30/16 11:54 PM  Result Value Ref Range Status   Specimen Description BLOOD RIGHT ASSIST CONTROL  Final    Special Requests   Final    BOTTLES DRAWN AEROBIC AND ANAEROBIC AER 9ML ANA 11ML   Culture NO GROWTH 2 DAYS  Final   Report Status PENDING  Incomplete  Culture, blood (routine x 2)     Status: None (Preliminary result)   Collection Time: 03/30/16 11:54 PM  Result Value Ref Range Status   Specimen Description BLOOD  LEFT FOREARM  Final   Special Requests   Final    BOTTLES DRAWN AEROBIC AND ANAEROBIC  ANA 1ML AER 1ML   Culture NO GROWTH 2 DAYS  Final   Report Status PENDING  Incomplete  Urine culture     Status: None   Collection Time: 03/31/16  8:24 AM  Result Value Ref Range Status   Specimen Description URINE, RANDOM  Final   Special Requests Normal  Final   Culture NO GROWTH Performed at [facility]   Final   Report Status 04/01/2016 FINAL  Final     Studies: Dg Chest 1 View  Result Date: 04/01/2016 CLINICAL DATA:  Fever, possible sepsis, diabetes mellitus EXAM: CHEST 1 VIEW COMPARISON:  Portable exam 1137 hours compared to 03/31/2016 FINDINGS: Normal heart size, mediastinal contours, and pulmonary vascularity. Atherosclerotic calcification aorta. Minimal subsegmental atelectasis RIGHT base. Lungs otherwise clear. No pleural effusion or pneumothorax. Bones unremarkable. IMPRESSION: Minimal subsegmental atelectasis RIGHT base. Aortic atherosclerosis. Electronically Signed   By: Lavonia Dana M.D.   On: 04/01/2016 11:52    Scheduled Meds: . amiodarone  200 mg Oral Daily  . amoxicillin-clavulanate  1 tablet Oral Q12H  . docusate sodium  100 mg Oral BID  . feeding supplement (ENSURE ENLIVE)  237 mL Oral QID  . heparin  5,000 Units Subcutaneous Q8H  . insulin aspart  0-15 Units Subcutaneous TID WC  . metoprolol succinate  25 mg Oral Daily  . nortriptyline  10 mg Oral QHS  . nystatin   Topical BID  . potassium chloride  40 mEq Oral Q4H  . sodium chloride flush  3 mL Intravenous Q12H      Assessment/Plan:  1. Fever, initial tachycardia and leukocytosis.Patient now  noticed to have possible abscess in the rectal region I will have surgery take a look at and we'll start him on Augmentin 2. Relative hypotension. Decrease metoprolol to 25 mg.  3. Weakness. Physical therapy recommends rehabilitation likely tomorrow 4. 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 Overall prognosis is poor. Patient still a full code.  5. Severe hypomagnesemia. Being replaced 6. Severe hypokalemia on presentation. Being replaced 7. Atrial fibrillation history on amiodarone and decreased dose of metoprolol 8. Stop statin  Code Status:  Code Status Orders        Start     Ordered   03/31/16 0520  Full code  Continuous     03/31/16 0519    Code Status History    Date Active Date Inactive Code Status Order ID Comments User Context   03/26/2016  8:06 PM 03/27/2016  4:56 PM Full Code [id number]  Nicholes Mango, MD Inpatient   12/29/2015  1:21 AM 12/31/2015  4:03 PM Full Code [id number]  Lance Coon, MD Inpatient   12/16/2015  7:14 PM 12/19/2015  5:07 PM Full Code [id number]  Newman Pies, MD Inpatient    Advance Directive Documentation   Flowsheet Row Most Recent Value  Type of Advance Directive  Healthcare Power of Attorney, Living will  Pre-existing out of facility DNR order (yellow form or pink MOST form)  No data  "MOST" Form in Place?  No data     Family Communication: Status the case with wife at bedside  Disposition Plan: Will go to [place] tomorrow  Antibiotics: - Augmentin  Time spent: 25 minutes  [place], [facility]

## 2016-04-02 NOTE — Discharge Instructions (Addendum)
[place] at [place]:  Regular diet  DISCHARGE CONDITION:  Stable  ACTIVITY:  Activity as tolerated  OXYGEN:  Home Oxygen: No.   Oxygen Delivery: room air  DISCHARGE LOCATION:  nursing home    ADDITIONAL DISCHARGE INSTRUCTION:check bmp in 2 days follow up on K+  Sitz baths twice daily after each bowel movement x 3 days then as needed bid Perianal would - David Archer remains in place for now. It is okay if it falls off. If will need to be removed tomorrow during dressing change.   If you experience worsening of your admission symptoms, develop shortness of breath, life threatening emergency, suicidal or homicidal thoughts you must seek medical attention immediately by calling 911 or calling your MD immediately  if symptoms less severe.  You Must read complete instructions/literature along with all the possible adverse reactions/side effects for all the Medicines you take and that have been prescribed to you. Take any new Medicines after you have completely understood and accpet all the possible adverse reactions/side effects.   Please note  You were cared for by a hospitalist during your hospital stay. If you have any questions about your discharge medications or the care you received while you were in the hospital after you are discharged, you can call the unit and asked to speak with the hospitalist on call if the hospitalist that took care of you is not available. Once you are discharged, your primary care physician will handle any further medical issues. Please note that NO REFILLS for any discharge medications will be authorized once you are discharged, as it is imperative that you return to your primary care physician (or establish a relationship with a primary care physician if you do not have one) for your aftercare needs so that they can reassess your need for medications and monitor your lab values.

## 2016-04-02 NOTE — Op Note (Signed)
  Procedure Date:  04/02/2016  Pre-operative Diagnosis:  Right perianal abscess  Post-operative Diagnosis: Right perianal abscess  Procedure:  Incision and drainage of right perianal abscess  Surgeon:  Melvyn Neth, MD  Anesthesia:  General endotracheal  Estimated Blood Loss:  2 ml  Specimens:  None  Complications:  None  Indications for Procedure:  This is a 65 y.o. male just diagnosed with a right perianal abscess. The risks and benefits of the procedure have been explained to the patient and he is willing to proceed.  Description of Procedure: The patient was correctly identified at bedside. Consent had been signed. Patient was placed in lateral decubitus position with right side down. The patient's perianal abscess was identified and was noted to be continuously draining at that time. The perianal area was prepped and draped with Betadine. 5 mL of 1% lidocaine were infiltrated subcutaneously around the abscess. A cruciate incision was made in the area of drainage resulting in copious amounts of purulent fluid drainage. Forceps were used to break any pockets of pus within the cavity. This cavity measured 3 cm in diameter and 1 cm in depth. The cavity was irrigated and after all the pus was expressed the wound was packed with quarter-inch Nu Gauze. The wound was then covered with fluff gauze and ABD pad and secured with mesh underwear.  The patient tolerated the procedure well and there were no gross complications.   Melvyn Neth, MD

## 2016-04-03 LAB — CBC WITH DIFFERENTIAL/PLATELET
BASOS ABS: 0.2 10*3/uL — AB (ref 0–0.1)
Basophils Relative: 1 %
EOS ABS: 0 10*3/uL (ref 0–0.7)
Eosinophils Relative: 0 %
HCT: 27.3 % — ABNORMAL LOW (ref 40.0–52.0)
Hemoglobin: 9.2 g/dL — ABNORMAL LOW (ref 13.0–18.0)
LYMPHS ABS: 2.6 10*3/uL (ref 1.0–3.6)
LYMPHS PCT: 14 %
MCH: 34.4 pg — ABNORMAL HIGH (ref 26.0–34.0)
MCHC: 33.8 g/dL (ref 32.0–36.0)
MCV: 101.6 fL — ABNORMAL HIGH (ref 80.0–100.0)
MONO ABS: 2.1 10*3/uL — AB (ref 0.2–1.0)
Monocytes Relative: 11 %
NEUTROS PCT: 74 %
Neutro Abs: 14 10*3/uL — ABNORMAL HIGH (ref 1.4–6.5)
PLATELETS: 193 10*3/uL (ref 150–440)
RBC: 2.68 MIL/uL — ABNORMAL LOW (ref 4.40–5.90)
RDW: 17.5 % — AB (ref 11.5–14.5)
WBC: 18.9 10*3/uL — AB (ref 3.8–10.6)
nRBC: 3 /100 WBC — ABNORMAL HIGH

## 2016-04-03 LAB — BASIC METABOLIC PANEL
ANION GAP: 6 (ref 5–15)
BUN: 12 mg/dL (ref 6–20)
CALCIUM: 8.2 mg/dL — AB (ref 8.9–10.3)
CO2: 25 mmol/L (ref 22–32)
Chloride: 106 mmol/L (ref 101–111)
Creatinine, Ser: 0.46 mg/dL — ABNORMAL LOW (ref 0.61–1.24)
Glucose, Bld: 110 mg/dL — ABNORMAL HIGH (ref 65–99)
Potassium: 4.1 mmol/L (ref 3.5–5.1)
SODIUM: 137 mmol/L (ref 135–145)

## 2016-04-03 LAB — GLUCOSE, CAPILLARY
Glucose-Capillary: 103 mg/dL — ABNORMAL HIGH (ref 65–99)
Glucose-Capillary: 168 mg/dL — ABNORMAL HIGH (ref 65–99)

## 2016-04-03 MED ORDER — NYSTATIN 100000 UNIT/GM EX POWD: Freq: Two times a day (BID) | CUTANEOUS | 0 refills | Status: AC

## 2016-04-03 NOTE — Discharge Summary (Signed)
[place] at [facility], 65 y.o., DOB 1951/01/25, MRN [id number]. 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 The patient had just been in the hospital 4 days prior to his ER visit with pancytopenia.  On admission he was 14 days out from chemotherapy and Neulasta treatment. He was initially was admitted for sepsis however no source of infection was identified. Patient blood cultures and urine cultures have been negative.Patient was constipated and nurse gave supposotory noticed swelling in perianal area, It was c/w abscess, surgery was consulted and he was started on oral antibiotics as well as I/D was done. Patient is very deconditioned and is being arranged to go to rehabilitation. Currently his potassium is being replaced he'll need follow-up on his potassium in 2 days at the skilled nursing facility. Patient has had lack of appetite he'll be started on Megace to see if that would help with his appetite.  Surgery recommends following for the perianal abscess : Sitz baths twice daily and as needed after each bowel movement. Elza Rafter remains in place for now. It is okay if it falls off. If not it will be removed tomorrow  during dressing change.            Consults  None  Significant Tests:  See full reports for all details    Dg Chest 1 View  Result Date: 04/01/2016 CLINICAL DATA:  Fever, possible sepsis, diabetes mellitus EXAM: CHEST 1 VIEW COMPARISON:  Portable exam 1137 hours compared to 03/31/2016 FINDINGS: Normal heart size, mediastinal contours, and pulmonary vascularity. Atherosclerotic calcification aorta. Minimal subsegmental atelectasis RIGHT base. Lungs otherwise clear. No pleural effusion or pneumothorax. Bones unremarkable. IMPRESSION: Minimal subsegmental atelectasis RIGHT base. Aortic atherosclerosis. Electronically Signed   By: Lavonia Dana M.D.   On: 04/01/2016 11:52   Dg Chest 2 View  Result Date: 03/31/2016 CLINICAL DATA:  Fever on chemotherapy EXAM: CHEST  2 VIEW COMPARISON:  Chest CT 08/11/2015 FINDINGS: Cardiomediastinal contours are normal. No focal airspace consolidation or pulmonary edema. No pneumothorax or pleural effusion. IMPRESSION: No active cardiopulmonary disease. Electronically Signed   By: Ulyses Jarred M.D.   On: 03/31/2016 00:39       Today   Subjective:   David Archer  denies any symptoms feels well  Objective:   Blood pressure 117/70, pulse 88, temperature 98 F (36.7 C), temperature source Oral, resp. rate 20, height '5\' 11"'$  (1.803 m), weight 176 lb 8 oz (80.1 kg), SpO2 98 %.  .  Intake/Output Summary (Last 24 hours) at 04/03/16 1023 Last data filed at 04/02/16 2151  Gross per 24 hour  Intake                0 ml  Output               28 ml  Net              -  28 ml    Exam VITAL SIGNS: Blood pressure 117/70, pulse 88, temperature 98 F (36.7 C), temperature source Oral, resp. rate 20, height '5\' 11"'$  (1.803 m), weight 176 lb 8 oz (80.1 kg), SpO2 98 %.  GENERAL:  65 y.o.-year-old patient lying in the bed with no acute distress.  EYES: Pupils equal, round, reactive to light and accommodation. No scleral icterus. Extraocular muscles intact.   HEENT: Head atraumatic, normocephalic. Oropharynx and nasopharynx clear.  NECK:  Supple, no jugular venous distention. No thyroid enlargement, no tenderness.  LUNGS: Normal breath sounds bilaterally, no wheezing, rales,rhonchi or crepitation. No use of accessory muscles of respiration.  CARDIOVASCULAR: S1, S2 normal. No murmurs, rubs, or gallops.  ABDOMEN: Soft, nontender, nondistended. Bowel sounds present. No organomegaly or mass.  EXTREMITIES: No pedal edema, cyanosis, or clubbing.  NEUROLOGIC: Cranial nerves II through XII are intact. Muscle strength 5/5 in all extremities. Sensation intact. Gait not checked.  PSYCHIATRIC: The patient is alert and oriented x 3.  SKIN: No obvious rash, lesion, or ulcer.   Data Review     CBC w Diff:  Lab Results  Component Value Date   WBC 18.9 (H) 04/03/2016   HGB 9.2 (L) 04/03/2016   HCT 27.3 (L) 04/03/2016   PLT 193 04/03/2016   LYMPHOPCT 14 04/03/2016   BANDSPCT 11 03/30/2016   MONOPCT 11 04/03/2016   EOSPCT 0 04/03/2016   BASOPCT 1 04/03/2016   CMP:  Lab Results  Component Value Date   NA 137 04/03/2016   K 4.1 04/03/2016   CL 106 04/03/2016   CO2 25 04/03/2016   BUN 12 04/03/2016   CREATININE 0.46 (L) 04/03/2016   PROT 6.5 03/30/2016   ALBUMIN 3.1 (L) 03/30/2016   BILITOT 1.0 03/30/2016   ALKPHOS 81 03/30/2016   AST 31 03/30/2016   ALT 24 03/30/2016  .  Micro Results Recent Results (from the past 240 hour(s))  C difficile quick scan w PCR reflex     Status: None   Collection Time: 03/27/16 12:00 AM  Result Value Ref Range Status   C Diff antigen NEGATIVE NEGATIVE Final   C Diff toxin NEGATIVE NEGATIVE Final   C Diff interpretation No C. difficile detected.  Final  Culture, blood (routine x 2)     Status: None (Preliminary result)   Collection Time: 03/30/16 11:54 PM  Result Value Ref Range Status   Specimen Description BLOOD RIGHT ASSIST CONTROL  Final   Special Requests   Final    BOTTLES DRAWN AEROBIC AND  ANAEROBIC AER 9ML ANA 11ML   Culture NO GROWTH 3 DAYS  Final   Report Status PENDING  Incomplete  Culture, blood (routine x 2)     Status: None (Preliminary result)   Collection Time: 03/30/16 11:54 PM  Result Value Ref Range Status   Specimen Description BLOOD  LEFT FOREARM  Final   Special Requests   Final    BOTTLES DRAWN AEROBIC AND ANAEROBIC  ANA 1ML AER 1ML   Culture NO GROWTH 3 DAYS  Final   Report Status PENDING  Incomplete  Urine culture     Status: None   Collection Time: 03/31/16  8:24 AM  Result Value Ref Range Status   Specimen Description URINE, RANDOM  Final   Special Requests Normal  Final   Culture NO GROWTH Performed at [facility]   Final   Report Status 04/01/2016 FINAL  Final        Code Status Orders  Start     Ordered   03/31/16 0520  Full code  Continuous     03/31/16 0519    Code Status History    Date Active Date Inactive Code Status Order ID Comments User Context   03/26/2016  8:06 PM 03/27/2016  4:56 PM Full Code [id number]  Nicholes Mango, MD Inpatient   12/29/2015  1:21 AM 12/31/2015  4:03 PM Full Code [id number]  Lance Coon, MD Inpatient   12/16/2015  7:14 PM 12/19/2015  5:07 PM Full Code [id number]  Newman Pies, MD Inpatient    Advance Directive Documentation   Flowsheet Row Most Recent Value  Type of Advance Directive  Healthcare Power of Attorney, Living will  Pre-existing out of facility DNR order (yellow form or pink MOST form)  No data  "MOST" Form in Place?  No data           Contact information for follow-up providers    Sherrin Daisy, MD Follow up in 2 week(s).   Specialty:  Family Medicine Contact information: [place] [address] [place], MD Follow up in 1 week(s).   Specialty:  Internal Medicine Contact information: [place] [address] [phone]        Olean Ree, MD Follow up in 1 week(s).   Specialty:  Surgery Why:   perianal abscess Contact information: [place] [address] [phone]            Contact information for after-discharge care    [facility] Follow up.   Specialty:  [place] information: [place] [phone]                  Discharge Medications     Medication List    TAKE these medications   amiodarone 200 MG tablet Commonly known as:  PACERONE Take 1 tablet (200 mg total) by mouth daily.   amoxicillin-clavulanate 875-125 MG tablet Commonly known as:  AUGMENTIN Take 1 tablet by mouth every 12 (twelve) hours.   docusate sodium 100 MG capsule Commonly known as:  COLACE Take 1 capsule (100 mg total) by mouth 2 (two) times daily.   feeding supplement (ENSURE ENLIVE) Liqd Take 237 mLs by mouth 4 (four) times daily.   fluticasone 50 MCG/ACT nasal spray Commonly known as:  FLONASE Place 2 sprays into both nostrils daily as needed for allergies or rhinitis.   HYDROcodone-acetaminophen 5-325 MG tablet Commonly known as:  NORCO/VICODIN Take 1-2 tablets by mouth every 4 (four) hours as needed for moderate pain.   insulin aspart 100 UNIT/ML injection Commonly known as:  novoLOG Inject 6 Units into the skin 3 (three) times daily before meals.   insulin glargine 100 UNIT/ML injection Commonly known as:  LANTUS Inject 0.1 mLs (10 Units total) into the skin at bedtime.   megestrol 400 MG/10ML suspension Commonly known as:  MEGACE Take 20 mLs (800 mg total) by mouth daily.   metoprolol succinate 100 MG 24 hr tablet Commonly known as:  TOPROL-XL Take 1 tablet (100 mg total) by mouth daily. Take with or immediately following a meal.   nortriptyline 10 MG capsule Commonly known as:  PAMELOR Take 10 mg by mouth at bedtime.   nystatin powder Commonly known as:  MYCOSTATIN/NYSTOP Apply topically 2 (two) times daily.   rosuvastatin  10 MG tablet Commonly known as:  CRESTOR Take 10 mg by mouth daily.   senna 8.6 MG Tabs tablet Commonly known as:  SENOKOT Take 2 tablets (17.2 mg total) by mouth daily.          Total Time in preparing paper work, data evaluation and todays exam - 35 minutes  Dustin Flock M.D on 04/03/2016 at 10:23 AM  [facility]   Office  [phone]

## 2016-04-03 NOTE — Progress Notes (Signed)
Pt reports feeling well this morning; denies pain/co's. I&D site clean with packing coming out with stool on BSC this morming. Replaced gauze dsg x1.  Stool loose/soft brown with colace held.Sitz bath perfomed with pt tolerating well. Pt becomes slightly weak when up ambulatingPt requires reminding to finish eating/taking meds . Wife here. TC report given to [place] at  [facility] with pt accepted. Advised they can assist wife getting pt out of care. Discharge packet given to wife to take with pt.

## 2016-04-03 NOTE — Progress Notes (Signed)
Wife advised she will transport pt with pt ready to go. Transported in transport chair to private vehicle with discharge to [facility].

## 2016-04-03 NOTE — Progress Notes (Signed)
04/03/2016  Subjective: Patient is status post bedside incision and drainage of right perineal abscess. This morning patient reports that his pain in the perianal region has significantly improved compared to prior to procedure.   Vital signs: Temp:  [97.5 F (36.4 C)-98.3 F (36.8 C)] 98 F (36.7 C) (12/03 0918) Pulse Rate:  [82-90] 88 (12/03 0918) Resp:  [20] 20 (12/03 0918) BP: (106-117)/(65-73) 117/70 (12/03 0918) SpO2:  [96 %-98 %] 98 % (12/03 0918) Weight:  [80.1 kg (176 lb 8 oz)] 80.1 kg (176 lb 8 oz) (12/03 0535)   Intake/Output: 12/02 0701 - 12/03 0700 In: 0  Out: 28 [Urine:26; Stool:2] Last BM Date: 04/02/16  Physical Exam: Constitutional: No acute distress Rectal: Right perianal region with improved cellulitis. No further induration or fluctuance. There is minimal purulent fluid. Wick in place  Labs:   Recent Labs  04/02/16 0424 04/03/16 0540  WBC 15.5* 18.9*  HGB 8.4* 9.2*  HCT 24.7* 27.3*  PLT 125* 193    Recent Labs  04/02/16 0424 04/03/16 0540  NA 136 137  K 2.9* 4.1  CL 104 106  CO2 25 25  GLUCOSE 79 110*  BUN 12 12  CREATININE 0.58* 0.46*  CALCIUM 7.7* 8.2*   No results for input(s): LABPROT, INR in the last 72 hours.  Imaging: No results found.  Assessment/Plan: 65 year old male status post bedside incision and drainage of right perianal abscess.  -Continue oral Augmentin for a total course of 7 days for treatment of this abscess. -Sitz baths twice daily and as needed after each bowel movement. -Elza Rafter remains in place for now. It is okay if it falls off. If not it will be removed tomorrow during dressing change.   Melvyn Neth, [place]

## 2016-04-05 LAB — CULTURE, BLOOD (ROUTINE X 2)
CULTURE: NO GROWTH
Culture: NO GROWTH

## 2016-04-19 ENCOUNTER — Ambulatory Visit: Payer: BLUE CROSS/BLUE SHIELD | Admitting: Surgery

## 2016-06-02 DEATH — deceased

## 2017-11-27 IMAGING — MR MR HEAD WO/W CM
10 of 13 series · 36 of 48 positions shown · IV contrast (multihance)
Comparison: 05/08/2015.

CLINICAL DATA: Continued surveillance abnormal LEFT frontal white
matter abnormality. History of headache for 2 years, and 1 episode
of imbalance while driving.

EXAM:
MRI HEAD WITHOUT AND WITH CONTRAST
TECHNIQUE: Multiplanar, multiecho pulse sequences of the brain and surrounding
structures were obtained without and with intravenous contrast.
CONTRAST:  20mL MULTIHANCE GADOBENATE DIMEGLUMINE 529 MG/ML IV SOLN

[Series 4: DWI · axial · 4.0mm · 0.94mm/px · z∈[-56,+108]mm · 4 of 43 slices shown (1 of 4)]
[im 1/43]
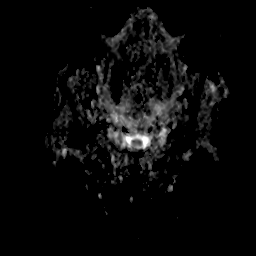
[im 15/43]
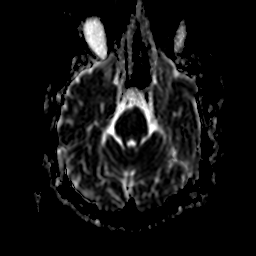
[im 29/43]
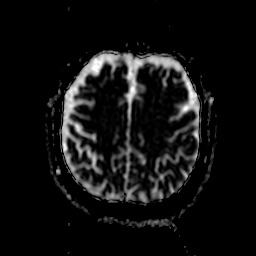
[im 43/43]
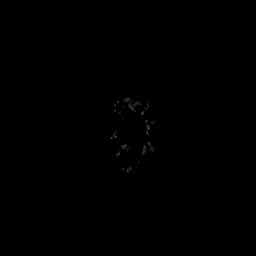

[Series 6: DWI · coronal · 5.0mm · 1.80mm/px · 4 of 39 slices shown (2 of 4)]
[im 1/39]
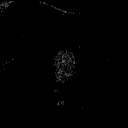
[im 13/39]
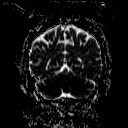
[im 26/39]
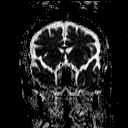
[im 39/39]
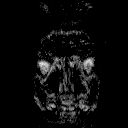

[Series 7: DWI · axial · 4.0mm · 0.94mm/px · z∈[-56,+104]mm · 4 of 42 slices shown (3 of 4)]
[im 1/42]
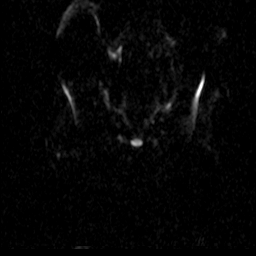
[im 14/42]
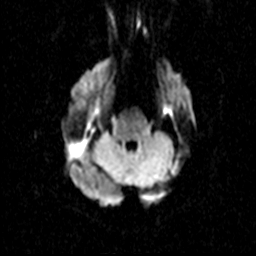
[im 28/42]
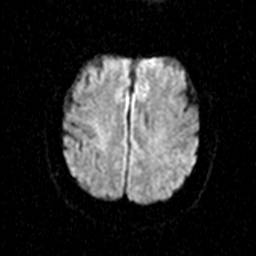
[im 42/42]
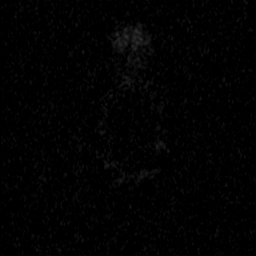

[Series 8: DWI · coronal · 5.0mm · 1.80mm/px · 4 of 37 slices shown (4 of 4)]
[im 1/37]
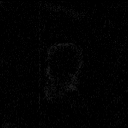
[im 13/37]
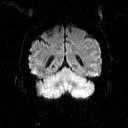
[im 25/37]
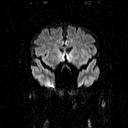
[im 37/37]
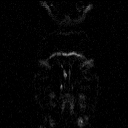

[Series 9: T2 · axial · 5.0mm · 0.45mm/px · z∈[-55,+103]mm · 3 of 26 slices shown (1 of 2)]
[im 1/26]
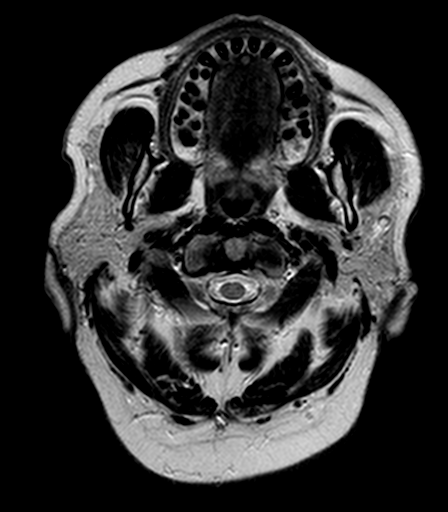
[im 13/26]
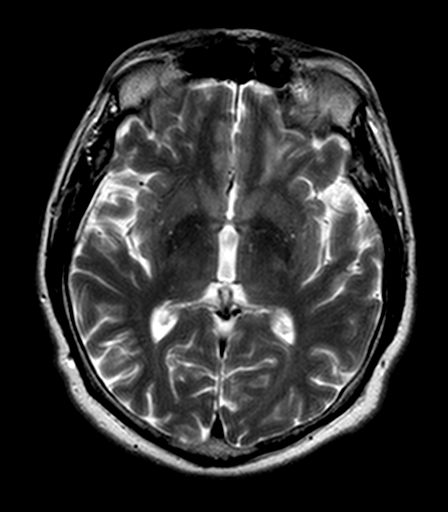
[im 26/26]
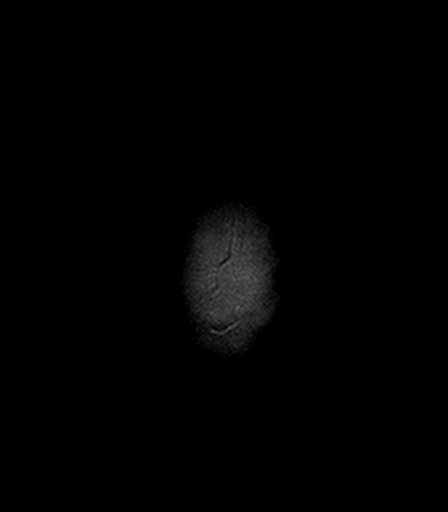

[Series 10: FLAIR · axial · 5.0mm · 0.90mm/px · z∈[-55,+103]mm · 3 of 26 slices shown]
[im 1/26]
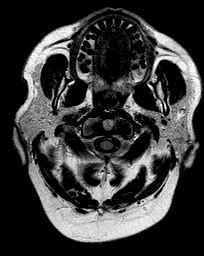
[im 13/26]
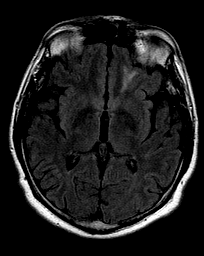
[im 26/26]
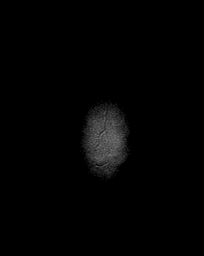

[Series 11: T2 · axial · 5.0mm · 0.45mm/px · z∈[-55,+21]mm · 2 of 26 slices shown (2 of 2)]
[im 1/26]
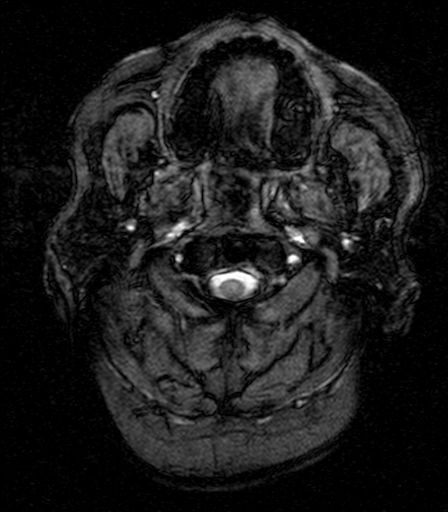
[im 13/26]
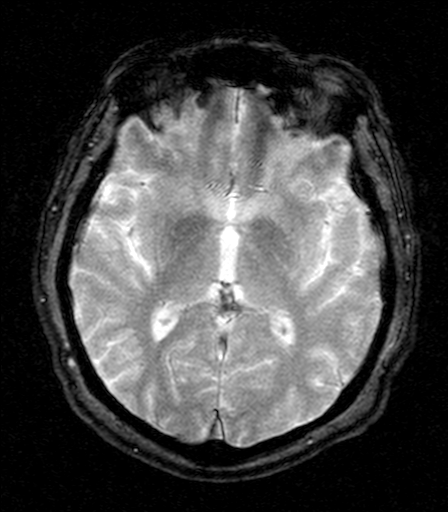

[Series 14: T1 post-contrast · axial · 3.0mm · 0.45mm/px · z∈[-56,+104]mm · 6 of 56 slices shown (1 of 3)]
[im 1/56]
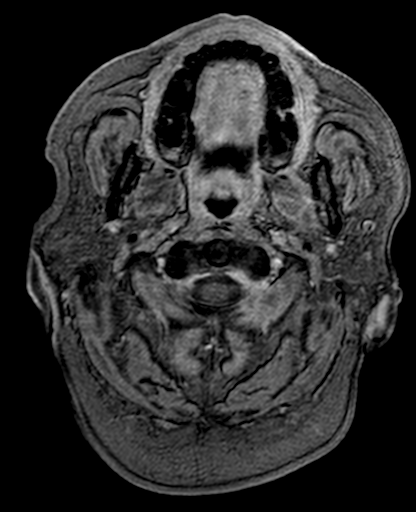
[im 12/56]
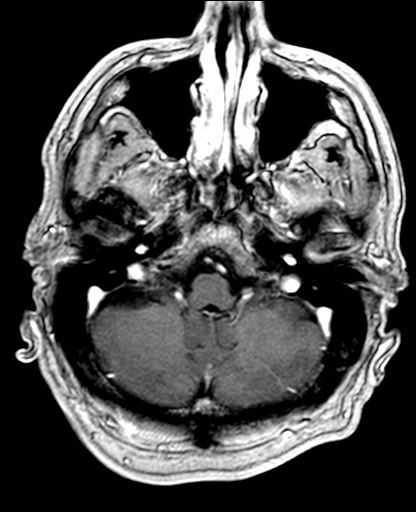
[im 23/56]
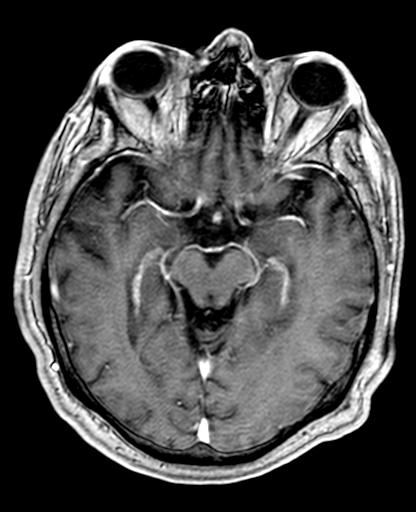
[im 34/56]
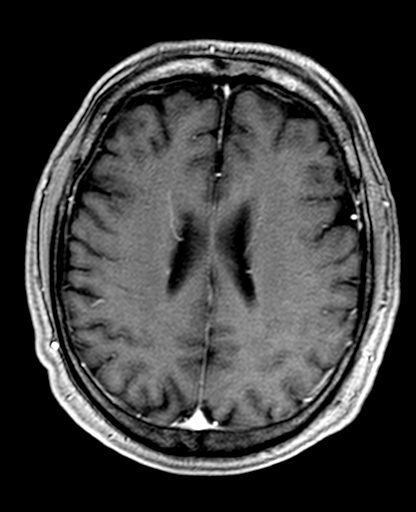
[im 45/56]
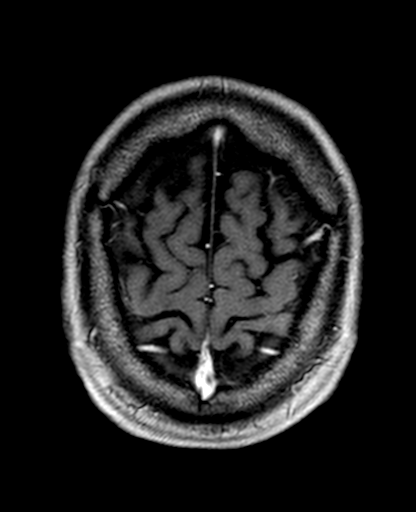
[im 56/56]
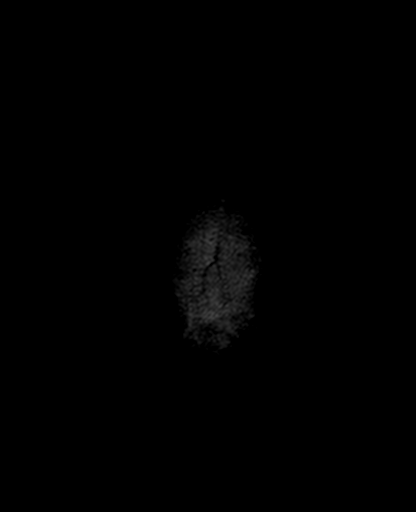

[Series 15: T1 post-contrast · coronal · 5.0mm · 0.45mm/px · 3 of 31 slices shown (2 of 3)]
[im 1/31]
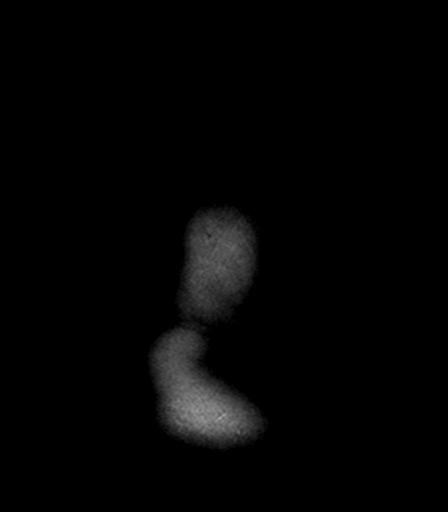
[im 16/31]
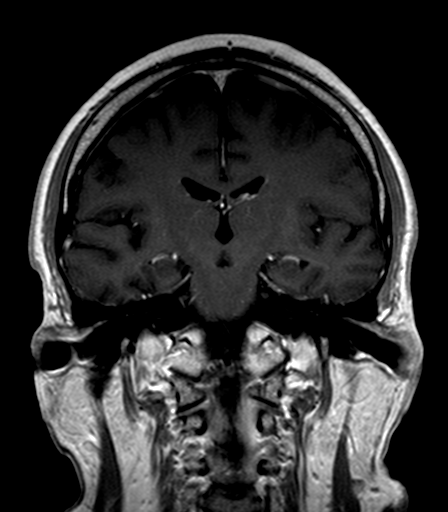
[im 31/31]
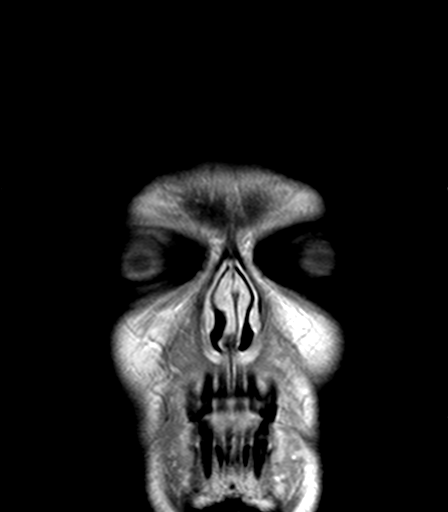

[Series 16: T1 post-contrast · sagittal · 5.0mm · 0.45mm/px · 3 of 31 slices shown (3 of 3)]
[im 1/31]
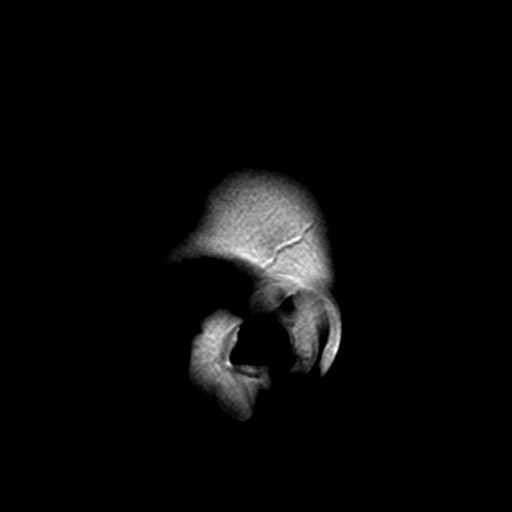
[im 16/31]
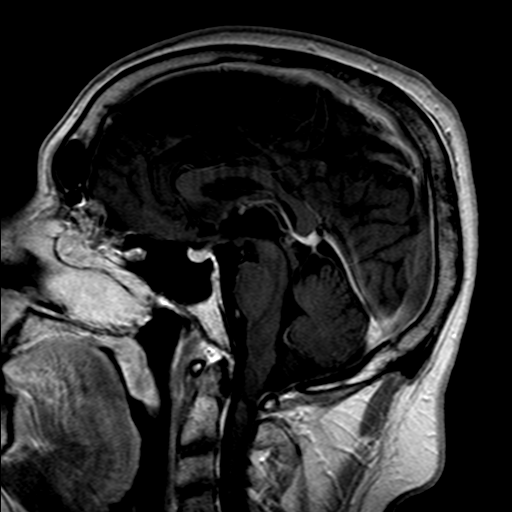
[im 31/31]
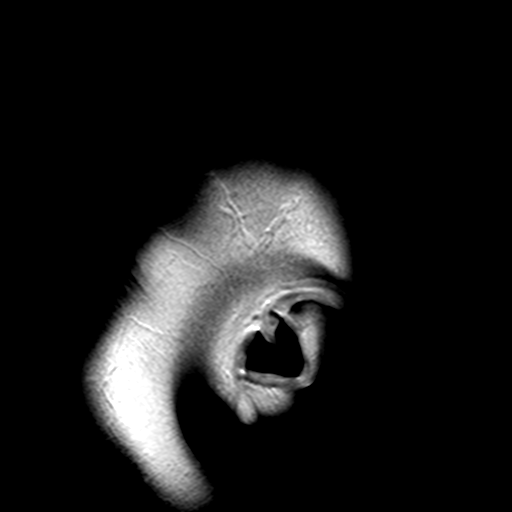

[36 of 48 positions shown; findings below may reference images not displayed]

FINDINGS: No acute stroke, acute hemorrhage, hydrocephalus, or extra-axial
fluid. Mild atrophy.

The previously noted area of a LEFT inferior frontal white matter
signal abnormality has progressed over the 2 month interval. The
lesion has enlarged, becomes centrally necrotic, and is surrounded
by vasogenic edema. There is irregular peripheral enhancement. No
restricted diffusion centrally within limits for assessment due to
proximity to the skull base. The lesion measures 10 x 16 x 10 mm. No
other similar abnormalities.

No osseous findings.  Extracranial soft tissues unremarkable.
IMPRESSION: Enlarging LEFT frontal lesion, 10 x 16 x 10 mm with peripheral
enhancement, central necrosis, and vasogenic edema. Solitary
metastatic deposit is favored. Primary brain tumor or infection are
other differential considerations.

A call is in to the ordering provider.
